# Patient Record
Sex: Female | Born: 1950 | Race: White | Hispanic: No | Marital: Married
Health system: Southern US, Community
[De-identification: ages and names within clinical notes are randomized; demographics above are authoritative.]

## PROBLEM LIST (undated history)

## (undated) ENCOUNTER — Emergency Department (HOSPITAL_COMMUNITY): Payer: Self-pay | Source: Home / Self Care

## (undated) DIAGNOSIS — I10 Essential (primary) hypertension: Secondary | ICD-10-CM

## (undated) DIAGNOSIS — R569 Unspecified convulsions: Secondary | ICD-10-CM

## (undated) DIAGNOSIS — Z9861 Coronary angioplasty status: Secondary | ICD-10-CM

## (undated) DIAGNOSIS — Z85118 Personal history of other malignant neoplasm of bronchus and lung: Secondary | ICD-10-CM

## (undated) DIAGNOSIS — E785 Hyperlipidemia, unspecified: Secondary | ICD-10-CM

## (undated) DIAGNOSIS — G4733 Obstructive sleep apnea (adult) (pediatric): Secondary | ICD-10-CM

## (undated) DIAGNOSIS — I2510 Atherosclerotic heart disease of native coronary artery without angina pectoris: Secondary | ICD-10-CM

## (undated) DIAGNOSIS — I480 Paroxysmal atrial fibrillation: Secondary | ICD-10-CM

## (undated) DIAGNOSIS — F329 Major depressive disorder, single episode, unspecified: Secondary | ICD-10-CM

## (undated) DIAGNOSIS — I5032 Chronic diastolic (congestive) heart failure: Secondary | ICD-10-CM

## (undated) DIAGNOSIS — J449 Chronic obstructive pulmonary disease, unspecified: Secondary | ICD-10-CM

## (undated) DIAGNOSIS — I739 Peripheral vascular disease, unspecified: Secondary | ICD-10-CM

## (undated) HISTORY — PX: FEMORAL-POPLITEAL BYPASS GRAFT: SHX937

---

## 2012-08-01 HISTORY — PX: CARDIAC CATHETERIZATION: SHX172

## 2014-08-01 HISTORY — PX: CARDIAC CATHETERIZATION: SHX172

## 2015-08-02 HISTORY — PX: CARDIAC CATHETERIZATION: SHX172

## 2017-05-31 ENCOUNTER — Inpatient Hospital Stay: Admission: RE | Admit: 2017-05-31 | Payer: Self-pay

## 2017-05-31 ENCOUNTER — Inpatient Hospital Stay
Admission: AD | Admit: 2017-05-31 | Discharge: 2017-07-03 | Disposition: A | Discharge status: Skilled Nursing Facility | Payer: Medicare Other | Source: Other Acute Inpatient Hospital | Attending: Internal Medicine | Admitting: Internal Medicine

## 2017-05-31 ENCOUNTER — Other Ambulatory Visit (HOSPITAL_COMMUNITY): Payer: Self-pay

## 2017-05-31 DIAGNOSIS — R0682 Tachypnea, not elsewhere classified: Secondary | ICD-10-CM

## 2017-05-31 DIAGNOSIS — R1110 Vomiting, unspecified: Secondary | ICD-10-CM

## 2017-05-31 DIAGNOSIS — J449 Chronic obstructive pulmonary disease, unspecified: Secondary | ICD-10-CM

## 2017-05-31 DIAGNOSIS — K5649 Other impaction of intestine: Secondary | ICD-10-CM

## 2017-05-31 DIAGNOSIS — K5900 Constipation, unspecified: Secondary | ICD-10-CM

## 2017-05-31 DIAGNOSIS — R0603 Acute respiratory distress: Secondary | ICD-10-CM

## 2017-05-31 DIAGNOSIS — N19 Unspecified kidney failure: Secondary | ICD-10-CM

## 2017-05-31 HISTORY — DX: Coronary angioplasty status: Z98.61

## 2017-05-31 HISTORY — DX: Chronic obstructive pulmonary disease, unspecified: J44.9

## 2017-05-31 HISTORY — DX: Obstructive sleep apnea (adult) (pediatric): G47.33

## 2017-05-31 HISTORY — DX: Unspecified convulsions: R56.9

## 2017-05-31 HISTORY — DX: Paroxysmal atrial fibrillation: I48.0

## 2017-05-31 HISTORY — DX: Major depressive disorder, single episode, unspecified: F32.9

## 2017-05-31 HISTORY — DX: Atherosclerotic heart disease of native coronary artery without angina pectoris: I25.10

## 2017-05-31 HISTORY — DX: Essential (primary) hypertension: I10

## 2017-05-31 HISTORY — DX: Personal history of other malignant neoplasm of bronchus and lung: Z85.118

## 2017-05-31 HISTORY — DX: Hyperlipidemia, unspecified: E78.5

## 2017-05-31 HISTORY — DX: Peripheral vascular disease, unspecified: I73.9

## 2017-05-31 HISTORY — DX: Chronic diastolic (congestive) heart failure: I50.32

## 2017-06-01 LAB — COMPREHENSIVE METABOLIC PANEL
ALBUMIN: 2.6 g/dL — AB (ref 3.5–5.0)
ALT: 12 U/L — ABNORMAL LOW (ref 14–54)
ANION GAP: 5 (ref 5–15)
AST: 13 U/L — ABNORMAL LOW (ref 15–41)
Alkaline Phosphatase: 46 U/L (ref 38–126)
BILIRUBIN TOTAL: 0.6 mg/dL (ref 0.3–1.2)
BUN: 32 mg/dL — ABNORMAL HIGH (ref 6–20)
CO2: 33 mmol/L — ABNORMAL HIGH (ref 22–32)
Calcium: 8.6 mg/dL — ABNORMAL LOW (ref 8.9–10.3)
Chloride: 104 mmol/L (ref 101–111)
Creatinine, Ser: 1.27 mg/dL — ABNORMAL HIGH (ref 0.44–1.00)
GFR calc Af Amer: 49 mL/min — ABNORMAL LOW (ref >60)
GFR, EST NON AFRICAN AMERICAN: 43 mL/min — AB (ref >60)
GLUCOSE: 95 mg/dL (ref 65–99)
POTASSIUM: 4.9 mmol/L (ref 3.5–5.1)
Sodium: 142 mmol/L (ref 135–145)
TOTAL PROTEIN: 5.4 g/dL — AB (ref 6.5–8.1)

## 2017-06-01 LAB — CBC WITH DIFFERENTIAL/PLATELET
Basophils Absolute: 0 10*3/uL (ref 0.0–0.1)
Basophils Relative: 0 %
EOS ABS: 0.1 10*3/uL (ref 0.0–0.7)
Eosinophils Relative: 2 %
HEMATOCRIT: 25.8 % — AB (ref 36.0–46.0)
HEMOGLOBIN: 7.9 g/dL — AB (ref 12.0–15.0)
LYMPHS ABS: 1 10*3/uL (ref 0.7–4.0)
Lymphocytes Relative: 14 %
MCH: 29.6 pg (ref 26.0–34.0)
MCHC: 30.6 g/dL (ref 30.0–36.0)
MCV: 96.6 fL (ref 78.0–100.0)
MONO ABS: 0.5 10*3/uL (ref 0.1–1.0)
MONOS PCT: 6 %
NEUTROS PCT: 78 %
Neutro Abs: 5.9 10*3/uL (ref 1.7–7.7)
Platelets: 150 10*3/uL (ref 150–400)
RBC: 2.67 MIL/uL — ABNORMAL LOW (ref 3.87–5.11)
RDW: 16.3 % — ABNORMAL HIGH (ref 11.5–15.5)
WBC: 7.5 10*3/uL (ref 4.0–10.5)

## 2017-06-01 LAB — PROTIME-INR
INR: 1.31
Prothrombin Time: 16.2 seconds — ABNORMAL HIGH (ref 11.4–15.2)

## 2017-06-01 LAB — TSH: TSH: 2.284 u[IU]/mL (ref 0.350–4.500)

## 2017-06-01 LAB — HEMOGLOBIN A1C
HEMOGLOBIN A1C: 5.3 % (ref 4.8–5.6)
MEAN PLASMA GLUCOSE: 105.41 mg/dL

## 2017-06-01 LAB — PHOSPHORUS: Phosphorus: 3 mg/dL (ref 2.5–4.6)

## 2017-06-01 LAB — MAGNESIUM: MAGNESIUM: 2.6 mg/dL — AB (ref 1.7–2.4)

## 2017-06-02 LAB — RENAL FUNCTION PANEL
ALBUMIN: 2.6 g/dL — AB (ref 3.5–5.0)
Anion gap: 5 (ref 5–15)
BUN: 32 mg/dL — AB (ref 6–20)
CO2: 34 mmol/L — ABNORMAL HIGH (ref 22–32)
CREATININE: 1.28 mg/dL — AB (ref 0.44–1.00)
Calcium: 8.9 mg/dL (ref 8.9–10.3)
Chloride: 102 mmol/L (ref 101–111)
GFR calc Af Amer: 49 mL/min — ABNORMAL LOW (ref >60)
GFR, EST NON AFRICAN AMERICAN: 42 mL/min — AB (ref >60)
Glucose, Bld: 135 mg/dL — ABNORMAL HIGH (ref 65–99)
PHOSPHORUS: 2.9 mg/dL (ref 2.5–4.6)
POTASSIUM: 4.9 mmol/L (ref 3.5–5.1)
Sodium: 141 mmol/L (ref 135–145)

## 2017-06-02 LAB — CBC
HCT: 24.4 % — ABNORMAL LOW (ref 36.0–46.0)
Hemoglobin: 7.5 g/dL — ABNORMAL LOW (ref 12.0–15.0)
MCH: 29.3 pg (ref 26.0–34.0)
MCHC: 30.7 g/dL (ref 30.0–36.0)
MCV: 95.3 fL (ref 78.0–100.0)
PLATELETS: 137 10*3/uL — AB (ref 150–400)
RBC: 2.56 MIL/uL — AB (ref 3.87–5.11)
RDW: 16.6 % — ABNORMAL HIGH (ref 11.5–15.5)
WBC: 7 10*3/uL (ref 4.0–10.5)

## 2017-06-02 LAB — MAGNESIUM: Magnesium: 2.4 mg/dL (ref 1.7–2.4)

## 2017-06-02 LAB — URINE CULTURE: Culture: NO GROWTH

## 2017-06-05 LAB — CBC
HCT: 27.9 % — ABNORMAL LOW (ref 36.0–46.0)
Hemoglobin: 8.6 g/dL — ABNORMAL LOW (ref 12.0–15.0)
MCH: 29.4 pg (ref 26.0–34.0)
MCHC: 30.8 g/dL (ref 30.0–36.0)
MCV: 95.2 fL (ref 78.0–100.0)
PLATELETS: 156 10*3/uL (ref 150–400)
RBC: 2.93 MIL/uL — AB (ref 3.87–5.11)
RDW: 16.9 % — AB (ref 11.5–15.5)
WBC: 7 10*3/uL (ref 4.0–10.5)

## 2017-06-05 LAB — RENAL FUNCTION PANEL
Albumin: 2.9 g/dL — ABNORMAL LOW (ref 3.5–5.0)
Anion gap: 8 (ref 5–15)
BUN: 46 mg/dL — AB (ref 6–20)
CALCIUM: 9.2 mg/dL (ref 8.9–10.3)
CHLORIDE: 98 mmol/L — AB (ref 101–111)
CO2: 35 mmol/L — AB (ref 22–32)
CREATININE: 1.37 mg/dL — AB (ref 0.44–1.00)
GFR, EST AFRICAN AMERICAN: 45 mL/min — AB (ref >60)
GFR, EST NON AFRICAN AMERICAN: 39 mL/min — AB (ref >60)
Glucose, Bld: 103 mg/dL — ABNORMAL HIGH (ref 65–99)
Phosphorus: 4.4 mg/dL (ref 2.5–4.6)
Potassium: 4.1 mmol/L (ref 3.5–5.1)
SODIUM: 141 mmol/L (ref 135–145)

## 2017-06-05 LAB — MAGNESIUM: Magnesium: 2.2 mg/dL (ref 1.7–2.4)

## 2017-06-07 ENCOUNTER — Other Ambulatory Visit (HOSPITAL_COMMUNITY): Payer: Self-pay

## 2017-06-07 LAB — BLOOD GAS, ARTERIAL
ACID-BASE EXCESS: 8.7 mmol/L — AB (ref 0.0–2.0)
BICARBONATE: 33.3 mmol/L — AB (ref 20.0–28.0)
Drawn by: 244851
O2 Content: 2 L/min
O2 Saturation: 98.6 %
PH ART: 7.425 (ref 7.350–7.450)
PO2 ART: 114 mmHg — AB (ref 83.0–108.0)
Patient temperature: 98.6
pCO2 arterial: 51.7 mmHg — ABNORMAL HIGH (ref 32.0–48.0)

## 2017-06-07 MED ORDER — POTASSIUM CHLORIDE 20 MEQ/15ML (10%) PO SOLN
ORAL | Status: DC
Start: ? — End: 2017-06-07

## 2017-06-07 MED ORDER — ATORVASTATIN CALCIUM 20 MG PO TABS
40.00 | ORAL_TABLET | ORAL | Status: DC
Start: 2017-06-01 — End: 2017-06-07

## 2017-06-07 MED ORDER — APIXABAN 5 MG PO TABS
5.00 | ORAL_TABLET | ORAL | Status: DC
Start: 2017-05-31 — End: 2017-06-07

## 2017-06-07 MED ORDER — AMIODARONE HCL 200 MG PO TABS
200.00 | ORAL_TABLET | ORAL | Status: DC
Start: 2017-06-01 — End: 2017-06-07

## 2017-06-07 MED ORDER — PREDNISONE 20 MG PO TABS
40.00 | ORAL_TABLET | ORAL | Status: DC
Start: 2017-06-01 — End: 2017-06-07

## 2017-06-07 MED ORDER — POTASSIUM CHLORIDE CRYS ER 20 MEQ PO TBCR
EXTENDED_RELEASE_TABLET | ORAL | Status: DC
Start: ? — End: 2017-06-07

## 2017-06-07 MED ORDER — FUROSEMIDE 20 MG PO TABS
20.00 | ORAL_TABLET | ORAL | Status: DC
Start: 2017-06-01 — End: 2017-06-07

## 2017-06-07 MED ORDER — UMECLIDINIUM BROMIDE 62.5 MCG/INH IN AEPB
INHALATION_SPRAY | RESPIRATORY_TRACT | Status: DC
Start: 2017-05-31 — End: 2017-06-07

## 2017-06-07 MED ORDER — GENERIC EXTERNAL MEDICATION
Status: DC
Start: ? — End: 2017-06-07

## 2017-06-07 MED ORDER — LIDOCAINE HCL (PF) 1 % IJ SOLN
.50 | INTRAMUSCULAR | Status: DC
Start: ? — End: 2017-06-07

## 2017-06-07 MED ORDER — FLUTICASONE FUROATE-VILANTEROL 100-25 MCG/INH IN AEPB
INHALATION_SPRAY | RESPIRATORY_TRACT | Status: DC
Start: 2017-06-01 — End: 2017-06-07

## 2017-06-07 MED ORDER — PANTOPRAZOLE SODIUM 40 MG PO TBEC
40.00 | DELAYED_RELEASE_TABLET | ORAL | Status: DC
Start: 2017-06-01 — End: 2017-06-07

## 2017-06-07 MED ORDER — DOCUSATE SODIUM 100 MG PO CAPS
100.00 | ORAL_CAPSULE | ORAL | Status: DC
Start: 2017-05-31 — End: 2017-06-07

## 2017-06-07 MED ORDER — ACETAMINOPHEN 325 MG PO TABS
650.00 | ORAL_TABLET | ORAL | Status: DC
Start: ? — End: 2017-06-07

## 2017-06-07 MED ORDER — NALOXONE HCL 0.4 MG/ML IJ SOLN
.40 | INTRAMUSCULAR | Status: DC
Start: ? — End: 2017-06-07

## 2017-06-07 MED ORDER — INSULIN LISPRO 100 UNIT/ML ~~LOC~~ SOLN
SUBCUTANEOUS | Status: DC
Start: 2017-05-31 — End: 2017-06-07

## 2017-06-07 MED ORDER — TRAZODONE HCL 50 MG PO TABS
50.00 | ORAL_TABLET | ORAL | Status: DC
Start: 2017-05-31 — End: 2017-06-07

## 2017-06-07 MED ORDER — INSULIN LISPRO 100 UNIT/ML ~~LOC~~ SOLN
SUBCUTANEOUS | Status: DC
Start: ? — End: 2017-06-07

## 2017-06-07 MED ORDER — INSULIN GLARGINE 100 UNIT/ML ~~LOC~~ SOLN
SUBCUTANEOUS | Status: DC
Start: 2017-05-31 — End: 2017-06-07

## 2017-06-07 MED ORDER — POTASSIUM CHLORIDE 20 MEQ/50ML IV SOLN
INTRAVENOUS | Status: DC
Start: ? — End: 2017-06-07

## 2017-06-07 MED ORDER — SERTRALINE HCL 50 MG PO TABS
50.00 | ORAL_TABLET | ORAL | Status: DC
Start: 2017-06-01 — End: 2017-06-07

## 2017-06-07 MED ORDER — FERROUS SULFATE 325 (65 FE) MG PO TABS
325.00 | ORAL_TABLET | ORAL | Status: DC
Start: 2017-06-01 — End: 2017-06-07

## 2017-06-07 MED ORDER — SODIUM CHLORIDE 0.9 % IV SOLN
INTRAVENOUS | Status: DC
Start: ? — End: 2017-06-07

## 2017-06-07 MED ORDER — ANTACID & ANTIGAS 200-200-20 MG/5ML PO SUSP
30.00 | ORAL | Status: DC
Start: ? — End: 2017-06-07

## 2017-06-07 MED ORDER — ALBUTEROL SULFATE (2.5 MG/3ML) 0.083% IN NEBU
2.50 | INHALATION_SOLUTION | RESPIRATORY_TRACT | Status: DC
Start: ? — End: 2017-06-07

## 2017-06-07 MED ORDER — ASPIRIN 81 MG PO CHEW
81.00 | CHEWABLE_TABLET | ORAL | Status: DC
Start: 2017-06-01 — End: 2017-06-07

## 2017-06-07 MED ORDER — LEVETIRACETAM 500 MG PO TABS
750.00 | ORAL_TABLET | ORAL | Status: DC
Start: 2017-05-31 — End: 2017-06-07

## 2017-06-07 MED ORDER — ONDANSETRON HCL 4 MG PO TABS
4.00 | ORAL_TABLET | ORAL | Status: DC
Start: ? — End: 2017-06-07

## 2017-06-07 MED ORDER — NITROGLYCERIN 0.4 MG SL SUBL
.40 | SUBLINGUAL_TABLET | SUBLINGUAL | Status: DC
Start: ? — End: 2017-06-07

## 2017-06-09 LAB — CBC
HCT: 30.3 % — ABNORMAL LOW (ref 36.0–46.0)
HEMOGLOBIN: 9.1 g/dL — AB (ref 12.0–15.0)
MCH: 28.5 pg (ref 26.0–34.0)
MCHC: 30 g/dL (ref 30.0–36.0)
MCV: 95 fL (ref 78.0–100.0)
Platelets: 167 10*3/uL (ref 150–400)
RBC: 3.19 MIL/uL — AB (ref 3.87–5.11)
RDW: 15.8 % — ABNORMAL HIGH (ref 11.5–15.5)
WBC: 7.5 10*3/uL (ref 4.0–10.5)

## 2017-06-09 LAB — BASIC METABOLIC PANEL
ANION GAP: 10 (ref 5–15)
BUN: 58 mg/dL — AB (ref 6–20)
CHLORIDE: 95 mmol/L — AB (ref 101–111)
CO2: 36 mmol/L — ABNORMAL HIGH (ref 22–32)
Calcium: 9.2 mg/dL (ref 8.9–10.3)
Creatinine, Ser: 1.87 mg/dL — ABNORMAL HIGH (ref 0.44–1.00)
GFR calc non Af Amer: 27 mL/min — ABNORMAL LOW (ref >60)
GFR, EST AFRICAN AMERICAN: 31 mL/min — AB (ref >60)
Glucose, Bld: 113 mg/dL — ABNORMAL HIGH (ref 65–99)
POTASSIUM: 5.6 mmol/L — AB (ref 3.5–5.1)
SODIUM: 141 mmol/L (ref 135–145)

## 2017-06-10 ENCOUNTER — Other Ambulatory Visit (HOSPITAL_COMMUNITY): Payer: Self-pay

## 2017-06-10 LAB — BASIC METABOLIC PANEL
ANION GAP: 9 (ref 5–15)
BUN: 52 mg/dL — AB (ref 6–20)
CHLORIDE: 98 mmol/L — AB (ref 101–111)
CO2: 32 mmol/L (ref 22–32)
Calcium: 8.5 mg/dL — ABNORMAL LOW (ref 8.9–10.3)
Creatinine, Ser: 1.35 mg/dL — ABNORMAL HIGH (ref 0.44–1.00)
GFR, EST AFRICAN AMERICAN: 46 mL/min — AB (ref >60)
GFR, EST NON AFRICAN AMERICAN: 40 mL/min — AB (ref >60)
Glucose, Bld: 130 mg/dL — ABNORMAL HIGH (ref 65–99)
POTASSIUM: 4.4 mmol/L (ref 3.5–5.1)
SODIUM: 139 mmol/L (ref 135–145)

## 2017-06-12 ENCOUNTER — Other Ambulatory Visit (HOSPITAL_COMMUNITY): Payer: Self-pay

## 2017-06-12 LAB — BASIC METABOLIC PANEL
Anion gap: 7 (ref 5–15)
BUN: 44 mg/dL — AB (ref 6–20)
CHLORIDE: 102 mmol/L (ref 101–111)
CO2: 32 mmol/L (ref 22–32)
CREATININE: 1.22 mg/dL — AB (ref 0.44–1.00)
Calcium: 8.5 mg/dL — ABNORMAL LOW (ref 8.9–10.3)
GFR calc Af Amer: 52 mL/min — ABNORMAL LOW (ref >60)
GFR calc non Af Amer: 45 mL/min — ABNORMAL LOW (ref >60)
Glucose, Bld: 97 mg/dL (ref 65–99)
Potassium: 4.3 mmol/L (ref 3.5–5.1)
Sodium: 141 mmol/L (ref 135–145)

## 2017-06-16 LAB — CBC
HCT: 29.9 % — ABNORMAL LOW (ref 36.0–46.0)
Hemoglobin: 9.2 g/dL — ABNORMAL LOW (ref 12.0–15.0)
MCH: 29.8 pg (ref 26.0–34.0)
MCHC: 30.8 g/dL (ref 30.0–36.0)
MCV: 96.8 fL (ref 78.0–100.0)
PLATELETS: 142 10*3/uL — AB (ref 150–400)
RBC: 3.09 MIL/uL — ABNORMAL LOW (ref 3.87–5.11)
RDW: 15.8 % — AB (ref 11.5–15.5)
WBC: 7.2 10*3/uL (ref 4.0–10.5)

## 2017-06-16 LAB — RENAL FUNCTION PANEL
Albumin: 2.7 g/dL — ABNORMAL LOW (ref 3.5–5.0)
Anion gap: 6 (ref 5–15)
BUN: 44 mg/dL — AB (ref 6–20)
CHLORIDE: 101 mmol/L (ref 101–111)
CO2: 33 mmol/L — ABNORMAL HIGH (ref 22–32)
CREATININE: 1.47 mg/dL — AB (ref 0.44–1.00)
Calcium: 8.9 mg/dL (ref 8.9–10.3)
GFR calc Af Amer: 41 mL/min — ABNORMAL LOW (ref >60)
GFR, EST NON AFRICAN AMERICAN: 36 mL/min — AB (ref >60)
GLUCOSE: 110 mg/dL — AB (ref 65–99)
POTASSIUM: 4.8 mmol/L (ref 3.5–5.1)
Phosphorus: 4 mg/dL (ref 2.5–4.6)
Sodium: 140 mmol/L (ref 135–145)

## 2017-06-16 LAB — MAGNESIUM: MAGNESIUM: 2.5 mg/dL — AB (ref 1.7–2.4)

## 2017-06-17 LAB — URINALYSIS, ROUTINE W REFLEX MICROSCOPIC
BILIRUBIN URINE: NEGATIVE
Glucose, UA: NEGATIVE mg/dL
HGB URINE DIPSTICK: NEGATIVE
KETONES UR: NEGATIVE mg/dL
NITRITE: NEGATIVE
PH: 5 (ref 5.0–8.0)
Protein, ur: NEGATIVE mg/dL
Specific Gravity, Urine: 1.016 (ref 1.005–1.030)

## 2017-06-19 LAB — PHOSPHORUS: Phosphorus: 3.3 mg/dL (ref 2.5–4.6)

## 2017-06-19 LAB — URINE CULTURE

## 2017-06-19 LAB — BASIC METABOLIC PANEL
Anion gap: 6 (ref 5–15)
BUN: 43 mg/dL — ABNORMAL HIGH (ref 6–20)
CALCIUM: 8.2 mg/dL — AB (ref 8.9–10.3)
CO2: 29 mmol/L (ref 22–32)
CREATININE: 1.43 mg/dL — AB (ref 0.44–1.00)
Chloride: 104 mmol/L (ref 101–111)
GFR, EST AFRICAN AMERICAN: 43 mL/min — AB (ref >60)
GFR, EST NON AFRICAN AMERICAN: 37 mL/min — AB (ref >60)
GLUCOSE: 105 mg/dL — AB (ref 65–99)
Potassium: 4.5 mmol/L (ref 3.5–5.1)
Sodium: 139 mmol/L (ref 135–145)

## 2017-06-19 LAB — CBC
HEMATOCRIT: 28.5 % — AB (ref 36.0–46.0)
Hemoglobin: 8.5 g/dL — ABNORMAL LOW (ref 12.0–15.0)
MCH: 28.4 pg (ref 26.0–34.0)
MCHC: 29.8 g/dL — ABNORMAL LOW (ref 30.0–36.0)
MCV: 95.3 fL (ref 78.0–100.0)
PLATELETS: 126 10*3/uL — AB (ref 150–400)
RBC: 2.99 MIL/uL — ABNORMAL LOW (ref 3.87–5.11)
RDW: 15.5 % (ref 11.5–15.5)
WBC: 8.1 10*3/uL (ref 4.0–10.5)

## 2017-06-19 LAB — MAGNESIUM: Magnesium: 2.4 mg/dL (ref 1.7–2.4)

## 2017-06-20 ENCOUNTER — Other Ambulatory Visit (HOSPITAL_COMMUNITY): Payer: Self-pay

## 2017-06-20 LAB — BASIC METABOLIC PANEL
Anion gap: 10 (ref 5–15)
BUN: 44 mg/dL — AB (ref 6–20)
CALCIUM: 8.6 mg/dL — AB (ref 8.9–10.3)
CHLORIDE: 107 mmol/L (ref 101–111)
CO2: 23 mmol/L (ref 22–32)
CREATININE: 1.37 mg/dL — AB (ref 0.44–1.00)
GFR calc Af Amer: 45 mL/min — ABNORMAL LOW (ref >60)
GFR calc non Af Amer: 39 mL/min — ABNORMAL LOW (ref >60)
Glucose, Bld: 125 mg/dL — ABNORMAL HIGH (ref 65–99)
Potassium: 4.8 mmol/L (ref 3.5–5.1)
SODIUM: 140 mmol/L (ref 135–145)

## 2017-06-20 MED ORDER — GENERIC EXTERNAL MEDICATION
Status: DC
Start: ? — End: 2017-06-20

## 2017-06-21 LAB — BASIC METABOLIC PANEL
ANION GAP: 7 (ref 5–15)
BUN: 54 mg/dL — ABNORMAL HIGH (ref 6–20)
CHLORIDE: 106 mmol/L (ref 101–111)
CO2: 26 mmol/L (ref 22–32)
CREATININE: 2.2 mg/dL — AB (ref 0.44–1.00)
Calcium: 8.4 mg/dL — ABNORMAL LOW (ref 8.9–10.3)
GFR calc non Af Amer: 22 mL/min — ABNORMAL LOW (ref >60)
GFR, EST AFRICAN AMERICAN: 25 mL/min — AB (ref >60)
Glucose, Bld: 164 mg/dL — ABNORMAL HIGH (ref 65–99)
POTASSIUM: 5.4 mmol/L — AB (ref 3.5–5.1)
SODIUM: 139 mmol/L (ref 135–145)

## 2017-06-21 LAB — POTASSIUM: POTASSIUM: 5 mmol/L (ref 3.5–5.1)

## 2017-06-21 LAB — MAGNESIUM: MAGNESIUM: 2.6 mg/dL — AB (ref 1.7–2.4)

## 2017-06-22 ENCOUNTER — Other Ambulatory Visit (HOSPITAL_COMMUNITY): Payer: Self-pay

## 2017-06-22 LAB — BASIC METABOLIC PANEL
ANION GAP: 10 (ref 5–15)
BUN: 65 mg/dL — ABNORMAL HIGH (ref 6–20)
CALCIUM: 7.5 mg/dL — AB (ref 8.9–10.3)
CHLORIDE: 107 mmol/L (ref 101–111)
CO2: 23 mmol/L (ref 22–32)
CREATININE: 2.58 mg/dL — AB (ref 0.44–1.00)
GFR calc non Af Amer: 18 mL/min — ABNORMAL LOW (ref >60)
GFR, EST AFRICAN AMERICAN: 21 mL/min — AB (ref >60)
GLUCOSE: 126 mg/dL — AB (ref 65–99)
POTASSIUM: 4.2 mmol/L (ref 3.5–5.1)
SODIUM: 140 mmol/L (ref 135–145)

## 2017-06-23 LAB — BASIC METABOLIC PANEL
Anion gap: 9 (ref 5–15)
BUN: 54 mg/dL — AB (ref 6–20)
CHLORIDE: 109 mmol/L (ref 101–111)
CO2: 24 mmol/L (ref 22–32)
Calcium: 7.8 mg/dL — ABNORMAL LOW (ref 8.9–10.3)
Creatinine, Ser: 2.1 mg/dL — ABNORMAL HIGH (ref 0.44–1.00)
GFR calc Af Amer: 27 mL/min — ABNORMAL LOW (ref >60)
GFR calc non Af Amer: 23 mL/min — ABNORMAL LOW (ref >60)
GLUCOSE: 178 mg/dL — AB (ref 65–99)
Potassium: 2.9 mmol/L — ABNORMAL LOW (ref 3.5–5.1)
Sodium: 142 mmol/L (ref 135–145)

## 2017-06-23 LAB — CBC
HEMATOCRIT: 25.7 % — AB (ref 36.0–46.0)
HEMOGLOBIN: 8 g/dL — AB (ref 12.0–15.0)
MCH: 29.9 pg (ref 26.0–34.0)
MCHC: 31.1 g/dL (ref 30.0–36.0)
MCV: 95.9 fL (ref 78.0–100.0)
Platelets: 137 10*3/uL — ABNORMAL LOW (ref 150–400)
RBC: 2.68 MIL/uL — ABNORMAL LOW (ref 3.87–5.11)
RDW: 15.7 % — ABNORMAL HIGH (ref 11.5–15.5)
WBC: 8 10*3/uL (ref 4.0–10.5)

## 2017-06-23 LAB — POTASSIUM: Potassium: 3.7 mmol/L (ref 3.5–5.1)

## 2017-06-24 LAB — BASIC METABOLIC PANEL
ANION GAP: 6 (ref 5–15)
BUN: 43 mg/dL — ABNORMAL HIGH (ref 6–20)
CO2: 24 mmol/L (ref 22–32)
Calcium: 7.8 mg/dL — ABNORMAL LOW (ref 8.9–10.3)
Chloride: 112 mmol/L — ABNORMAL HIGH (ref 101–111)
Creatinine, Ser: 1.71 mg/dL — ABNORMAL HIGH (ref 0.44–1.00)
GFR calc Af Amer: 35 mL/min — ABNORMAL LOW (ref >60)
GFR calc non Af Amer: 30 mL/min — ABNORMAL LOW (ref >60)
GLUCOSE: 95 mg/dL (ref 65–99)
POTASSIUM: 3.6 mmol/L (ref 3.5–5.1)
Sodium: 142 mmol/L (ref 135–145)

## 2017-06-24 LAB — MAGNESIUM: Magnesium: 2.4 mg/dL (ref 1.7–2.4)

## 2017-06-25 ENCOUNTER — Other Ambulatory Visit (HOSPITAL_COMMUNITY): Payer: Self-pay

## 2017-06-25 LAB — CK TOTAL AND CKMB (NOT AT ARMC)
CK TOTAL: 18 U/L — AB (ref 38–234)
CK, MB: 2.1 ng/mL (ref 0.5–5.0)
CK, MB: 2.9 ng/mL (ref 0.5–5.0)
RELATIVE INDEX: INVALID (ref 0.0–2.5)
Relative Index: INVALID (ref 0.0–2.5)
Total CK: 15 U/L — ABNORMAL LOW (ref 38–234)

## 2017-06-25 LAB — BASIC METABOLIC PANEL
ANION GAP: 6 (ref 5–15)
BUN: 33 mg/dL — ABNORMAL HIGH (ref 6–20)
CALCIUM: 7.6 mg/dL — AB (ref 8.9–10.3)
CO2: 22 mmol/L (ref 22–32)
CREATININE: 1.29 mg/dL — AB (ref 0.44–1.00)
Chloride: 114 mmol/L — ABNORMAL HIGH (ref 101–111)
GFR calc non Af Amer: 42 mL/min — ABNORMAL LOW (ref >60)
GFR, EST AFRICAN AMERICAN: 49 mL/min — AB (ref >60)
Glucose, Bld: 96 mg/dL (ref 65–99)
Potassium: 3.7 mmol/L (ref 3.5–5.1)
SODIUM: 142 mmol/L (ref 135–145)

## 2017-06-25 LAB — TROPONIN I
TROPONIN I: 0.05 ng/mL — AB (ref <0.03)
TROPONIN I: 0.06 ng/mL — AB (ref <0.03)
Troponin I: 0.04 ng/mL (ref <0.03)
Troponin I: 0.05 ng/mL (ref <0.03)

## 2017-06-26 LAB — CULTURE, BLOOD (ROUTINE X 2)
Culture: NO GROWTH
Culture: NO GROWTH
SPECIAL REQUESTS: ADEQUATE
Special Requests: ADEQUATE

## 2017-06-26 LAB — TROPONIN I: Troponin I: 0.06 ng/mL (ref <0.03)

## 2017-06-27 ENCOUNTER — Encounter: Payer: Self-pay | Admitting: Internal Medicine

## 2017-06-27 DIAGNOSIS — D649 Anemia, unspecified: Secondary | ICD-10-CM | POA: Diagnosis not present

## 2017-06-27 DIAGNOSIS — I480 Paroxysmal atrial fibrillation: Secondary | ICD-10-CM | POA: Diagnosis not present

## 2017-06-27 DIAGNOSIS — I214 Non-ST elevation (NSTEMI) myocardial infarction: Secondary | ICD-10-CM | POA: Diagnosis not present

## 2017-06-27 DIAGNOSIS — G4733 Obstructive sleep apnea (adult) (pediatric): Secondary | ICD-10-CM

## 2017-06-27 DIAGNOSIS — E782 Mixed hyperlipidemia: Secondary | ICD-10-CM | POA: Diagnosis not present

## 2017-06-27 DIAGNOSIS — I25110 Atherosclerotic heart disease of native coronary artery with unstable angina pectoris: Secondary | ICD-10-CM | POA: Diagnosis not present

## 2017-06-27 DIAGNOSIS — J449 Chronic obstructive pulmonary disease, unspecified: Secondary | ICD-10-CM | POA: Diagnosis not present

## 2017-06-27 LAB — CK TOTAL AND CKMB (NOT AT ARMC)
CK TOTAL: 85 U/L (ref 38–234)
CK, MB: 20.3 ng/mL — AB (ref 0.5–5.0)
CK, MB: 17.5 ng/mL — ABNORMAL HIGH (ref 0.5–5.0)
CK, MB: 14.7 ng/mL — ABNORMAL HIGH (ref 0.5–5.0)
RELATIVE INDEX: INVALID (ref 0.0–2.5)
Relative Index: INVALID (ref 0.0–2.5)
Relative Index: INVALID (ref 0.0–2.5)
Total CK: 69 U/L (ref 38–234)
Total CK: 55 U/L (ref 38–234)

## 2017-06-27 LAB — BASIC METABOLIC PANEL
ANION GAP: 7 (ref 5–15)
BUN: 25 mg/dL — ABNORMAL HIGH (ref 6–20)
CALCIUM: 8.7 mg/dL — AB (ref 8.9–10.3)
CHLORIDE: 110 mmol/L (ref 101–111)
CO2: 26 mmol/L (ref 22–32)
CREATININE: 1.26 mg/dL — AB (ref 0.44–1.00)
GFR calc non Af Amer: 43 mL/min — ABNORMAL LOW (ref >60)
GFR, EST AFRICAN AMERICAN: 50 mL/min — AB (ref >60)
GLUCOSE: 107 mg/dL — AB (ref 65–99)
POTASSIUM: 4.7 mmol/L (ref 3.5–5.1)
SODIUM: 143 mmol/L (ref 135–145)

## 2017-06-27 LAB — TROPONIN I
TROPONIN I: 2.75 ng/mL — AB (ref <0.03)
Troponin I: 3.23 ng/mL (ref <0.03)
Troponin I: 2.36 ng/mL (ref <0.03)

## 2017-06-27 LAB — CBC
HCT: 26.2 % — ABNORMAL LOW (ref 36.0–46.0)
HEMOGLOBIN: 7.8 g/dL — AB (ref 12.0–15.0)
MCH: 28.7 pg (ref 26.0–34.0)
MCHC: 29.8 g/dL — ABNORMAL LOW (ref 30.0–36.0)
MCV: 96.3 fL (ref 78.0–100.0)
PLATELETS: 150 10*3/uL (ref 150–400)
RBC: 2.72 MIL/uL — AB (ref 3.87–5.11)
RDW: 15.6 % — ABNORMAL HIGH (ref 11.5–15.5)
WBC: 6.3 10*3/uL (ref 4.0–10.5)

## 2017-06-27 NOTE — Consult Note (Signed)
Cardiology Consult    Patient ID: Heather Esparza MRN: [id number], DOB/AGE: 02-Dec-1950   Admit date: 05/31/2017 Date of Consult: 06/27/2017  Primary Physician: Jamesetta Geralds, MD Primary Cardiologist: Dr. Violeta Gelinas - [facility] Requesting Provider: Dr. Manson Passey - [facility]  Reason for Consult: Elevated troponin, chest pain  Patient Profile   Heather Esparza is a 66 y.o. female who is being seen today for the evaluation of elevated troponin at the request of Dr. Manson Passey. Patient with significant CAD history s/p 3 DES stents in RCA and NSTEMIs in the past along with chronic respiratory failure from COPD on chronic 2L oxygen, OSA, HTN, dCHF, HLD, morbid obesity, paroxysmal atrial fibrillation, and PAD s/p fem-pop bypass; she is currently in [facility] as bridge after recent hospitalization back to her SNF where she resides chronically. She started developing chest pain and was found to have elevated troponins with no acute EKG changes.  Past Medical History   Past Medical History:  Diagnosis Date  . CAD S/P percutaneous coronary angioplasty    RCA stents in 2014  . Chronic diastolic heart failure (HCC)   . COPD (chronic obstructive pulmonary disease) (HCC)    On chronic 2L [place], and Trelegy at night  . History of lung cancer   . HLD (hyperlipidemia)   . HTN (hypertension)   . Major depression   . Obstructive sleep apnea   . Paroxysmal atrial fibrillation (HCC)   . Peripheral arterial disease (HCC)    s/p fem-pop bypass  . Seizure (HCC)    on keppra    Past Surgical History:  Procedure Laterality Date  . CARDIAC CATHETERIZATION  2014   3 RCA stents  . CARDIAC CATHETERIZATION  2016   medical management  . CARDIAC CATHETERIZATION  2017   medical management  . FEMORAL-POPLITEAL BYPASS GRAFT      Allergies Allergies  Allergen Reactions  . Iodinated Diagnostic Agents Hives, Itching and Shortness Of Breath  . Meropenem     Other reaction(s): Seizures  . Digoxin And  Related Other (See Comments)    Other reaction(s): Other HOCM HOCM   . Latex Dermatitis, Hives and Rash  . Metformin Hcl Other (See Comments)    1[employer] Description: Metformin HCl TABS                                                    . Nitrates, Organic Other (See Comments)    Other reaction(s): Other (see comments) HOCM HOCM     History of Present Illness    Heather Esparza is a 66yo female seen in [facility] for evaluation of chest pain and elevated troponin. Patient states that about 4 days ago she started having some right sided chest pain that was similar to how she presented in the past with A fib with RVR; the pain radiated to her right neck and resolved on its own eventually. She had recurrence of pain more substernally a few times since then with occasional radiation to her bilateral jaw. The pain would subside on its own; she had not taken any nitro. She denies associated shortness of breath but does have occasional associated belching and dyspepsia which has been present in the past with her unstable angina and NSTEMIs. Pain always presents at rest; patient is nonambulatory due to femoral fracture 1.5 years ago.  During evaluation, patient had acute onset of similar chest pain and was gripping her left chest; she described it as similar to her previous NSTEMIs. Pain began to subside within 1-2 minutes without intervention. Telemetry during this episode revealed SR with HR in 70-80s. EKG was obtained and did not show ischemic changes compared to priors; SR.   Inpatient Medications    Ciprofloxacin 500mg  BID - end date 11/26 Amiodarone 200mg  MWF Anucort 25mg  BID suppository ASA 81mg  daily Atorvastatin 40mg  daily Eliquis 5mg  BID Ferrous sulfate 325mg  daily Fluticasone-salmetarol daily Lasix 20mg  PO daily Lantus 7 units qhs Keppra 750mg  BID Protonix 40mg  daily Prednisone 5mg  daily Zoloft 50mg  daily Trazodone 50mg  daily  Outpatient Medications    Prior  to Admission medications   Not on File   Eliquis 5mg  BID Tylenol prn Asa 81mg  dialy Atorvastatin 40mg  daily Amiodarone 200mg  daily Esomeprazole 40mg  daily Breo Ellipta daily Lasix 20mg  daily Gabapentin 300mg  BID INcuse Ellipta daily Duoneb PRN Levetiracetam 750mg  BID Metoprolol tartrate 25mg  BID Ondansetron 4mg  q8hr PRN Oxygen 2-3L PRN Miralax 17mg  daily Sertraline 50mg  dialy Trazodone 50mg  qhs  Family History    N/a No family history on file.  Social History    Social History   Socioeconomic History  . Marital status: Married    Spouse name: Not on file  . Number of children: Not on file  . Years of education: Not on file  . Highest education level: Not on file  Social Needs  . Financial resource strain: Not on file  . Food insecurity - worry: Not on file  . Food insecurity - inability: Not on file  . Transportation needs - medical: Not on file  . Transportation needs - non-medical: Not on file  Occupational History  . Not on file  Tobacco Use  . Smoking status: Not on file  Substance and Sexual Activity  . Alcohol use: Not on file  . Drug use: Not on file  . Sexual activity: Not on file  Other Topics Concern  . Not on file  Social History Narrative  . Not on file    Review of Systems   All other systems reviewed and are otherwise negative except as noted above.  Physical Exam    BP 132/78, HR 75, afebrile General: Pleasant, NAD Psych: Normal affect. Neuro: Alert and oriented X 3. Moves all extremities spontaneously. HEENT: Normal  Neck: Supple without bruits or JVD. Lungs:  Resp regular and unlabored, CTAB, decreased breath sounds throughout, no wheezing. Heart: RRR no s3, s4, or murmurs. Abdomen: Soft, non-tender, non-distended, +BS.  Extremities: No LE edema; Radial pulses intact L>R; DP pulses 1+ bilaterally; hammer toes, cold extremities  Labs    Troponin (Point of Care Test) No results for input(s): TROPIPOC in the last 72  hours. Recent Labs    06/25/17 1106  06/26/17 0203 06/27/17 0625 06/27/17 0931 06/27/17 1250  CKTOTAL 18*  --   --  85 69 55  CKMB 2.9  --   --  20.3* 17.5* 14.7*  TROPONINI 0.05*   < > 0.06* 3.23* 2.75* 2.36*   < > = values in this interval not displayed.   Lab Results  Component Value Date   WBC 6.3 06/27/2017   HGB 7.8 (L) 06/27/2017   HCT 26.2 (L) 06/27/2017   MCV 96.3 06/27/2017   PLT 150 06/27/2017    Recent Labs  Lab 06/27/17 0625  NA 143  K 4.7  CL 110  CO2 26  BUN 25*  CREATININE  1.26*  CALCIUM 8.7*  GLUCOSE 107*   No results found for: CHOL, HDL, LDLCALC, TRIG No results found for: [facility]   Radiology Studies    Ct Head Wo Contrast  Result Date: 06/07/2017 CLINICAL DATA:  Headache following recent trauma. Nausea and vomiting EXAM: CT HEAD WITHOUT CONTRAST TECHNIQUE: Contiguous axial images were obtained from the base of the skull through the vertex without intravenous contrast. COMPARISON:  None. FINDINGS: Brain: The ventricles and sulci are felt to be within normal limits for age. There is no intracranial mass, hemorrhage, extra-axial fluid collection, or midline shift. There is slight small vessel disease in the centra semiovale bilaterally. Elsewhere, gray-white compartments appear normal. No acute infarct evident. Vascular: No hyperdense vessel. There is calcification in each carotid siphon region as well as in each distal vertebral artery. Skull: Bony calvarium appears intact. Sinuses/Orbits: There is mucosal thickening in several ethmoid air cells bilaterally. There is patchy opacity in the inferior right frontal sinus. Other paranasal sinuses are clear. There is rightward deviation of the nasal septum. Orbits appear symmetric bilaterally. Other: Mastoid air cells are clear on the left. There is opacification of several inferior mastoid air cells on the right. IMPRESSION: Slight periventricular small vessel disease. No intracranial mass, hemorrhage, or  extra-axial fluid collection. There are multiple foci of arterial vascular calcification. There is mild paranasal sinus disease. There is opacification in several inferior mastoid air cells on the right. Electronically Signed   By: Bretta Bang III M.D.   On: 06/07/2017 16:09   Dg Chest Port 1 View  Result Date: 06/25/2017 CLINICAL DATA:  Tachypnea.  Respiratory distress. EXAM: PORTABLE CHEST 1 VIEW COMPARISON:  Radiographs of May 31, 2017. FINDINGS: Stable cardiomegaly and central pulmonary vascular congestion is noted. Stable bilateral diffuse lung densities are noted concerning for edema or atypical inflammation. Stable moderate left pleural effusion is noted with associated atelectasis or infiltrate. Aortic atherosclerosis. Bony thorax is unremarkable. IMPRESSION: Stable cardiomegaly and central pulmonary vascular congestion. Stable bilateral lung opacities are noted concerning for edema or atypical inflammation. Stable moderate left pleural effusion is noted with associated atelectasis or infiltrate. Electronically Signed   By: Lupita Raider, M.D.   On: 06/25/2017 08:41   Dg Chest Port 1 View  Result Date: 05/31/2017 CLINICAL DATA:  Renal failure.  Sepsis.  Cough. EXAM: PORTABLE CHEST 1 VIEW COMPARISON:  None. FINDINGS: Artifact overlies the chest. Cardiac silhouette is markedly enlarged. There is aortic atherosclerosis. There is abnormal density throughout the lungs consistent with fluid overload/ mild edema. There is more pronounced density in the lower chest bilaterally that could be due to a combination of pleural fluid, atelectasis and pneumonia. This appears worse on the left than the right. IMPRESSION: Probable congestive heart failure/ fluid overload. Bilateral effusions and abnormal lower lobe density left worse than right. Pneumonia could well be present in the lower lobes. Electronically Signed   By: Paulina Fusi M.D.   On: 05/31/2017 21:46   Dg Abd Portable 1v  Result Date:  06/22/2017 CLINICAL DATA:  Constipation EXAM: PORTABLE ABDOMEN - 1 VIEW COMPARISON:  Two days ago FINDINGS: Interval clearing of previously seen stool. Now gas is seen along distended but nondilated colon. Evidence of bilateral nephrolithiasis measuring up to 16 mm the left. Nonobstructive bowel gas pattern. IMPRESSION: 1. Interval clearing of previously seen large volume stool. 2. Moderate gas seen throughout the colon. 3. Probable bilateral renal calculi. Electronically Signed   By: Marnee Spring M.D.   On: 06/22/2017 17:24  Dg Abd Portable 1v  Result Date: 06/20/2017 CLINICAL DATA:  Initial evaluation for constipation. EXAM: PORTABLE ABDOMEN - 1 VIEW COMPARISON:  Prior radiograph from 06/12/2017. FINDINGS: Examination somewhat limited by body habitus and patient positioning. Bowel gas pattern within normal limits without obstruction. No appreciable free air. No abnormal bowel wall thickening. Moderate to large stool burden within the distal colon, which may be slightly improved from previous. Scoliosis with multilevel degenerative spondylolysis. IMPRESSION: Moderate to large stool burden within the distal colon, perhaps slightly improved relative to most recent radiograph from 06/12/2017, but still fairly significant. Electronically Signed   By: Rise Mu M.D.   On: 06/20/2017 04:10   Dg Abd Portable 1v  Result Date: 06/12/2017 CLINICAL DATA:  No bowel movement for the past 8 days. The patient ports abdominal pain and distension. EXAM: PORTABLE ABDOMEN - 1 VIEW COMPARISON:  Abdominal radiographs of May 10, 2017 FINDINGS: The colonic and rectal stool burden is increased. There is no small bowel obstructive pattern. No free extraluminal gas is observed. There is stable gentle levocurvature centered in the mid lumbar spine. IMPRESSION: Increase colonic and rectal stool burden. I cannot exclude a fecal impaction. Electronically Signed   By: David  [place] M.D.   On: 06/12/2017 11:46   Dg  Abd Portable 1v  Result Date: 06/10/2017 CLINICAL DATA:  Initial evaluation for 1 week history of vomiting. EXAM: PORTABLE ABDOMEN - 1 VIEW COMPARISON:  None. FINDINGS: Examination somewhat limited by body habitus and patient positioning. Visualized bowel gas pattern within normal limits without obstruction or ileus. No appreciable abnormal bowel wall thickening. Large volume stool throughout the colon and rectal vault, suggesting constipation. 2.4 cm calcification overlying the left abdomen suggestive of nephrolithiasis. Cholecystectomy clips noted. Extensive aorto bi-iliac atherosclerotic disease. I am female noted within the partially visualized left femur. Scoliosis with multilevel degenerative spondylolysis. IMPRESSION: 1. Nonobstructive bowel gas pattern. Large volume stool throughout the colon suggestive of constipation. 2. 2.3 cm calcification overlying the left abdomen, likely a renal calculus. 3. Prominent aorto bi-iliac atherosclerotic disease. Electronically Signed   By: Rise Mu M.D.   On: 06/10/2017 13:18    ECG & Cardiac Imaging    EKG 06/27/2017, personally reviewed - SR, poor R wave progression, 1mm ST elevation in lead V4 only; TW flattening in V4-6 compared to prior.  EKG 06/27/2017, personally reviewed - SR, no ST segment elevation or TWI  Echo 04/10/2017: The left ventricle is normal in size. Ejection Fraction = >55%. There is severe concentric left ventricular hypertrophy. The right ventricle is normal size. The right ventricle is normal in structure and function. Systolic anterior motion of mitral valve leaflet noted with chordal, septal contact. Associated turbulent flow noted. There is mild mitral regurgitation (1+). There is a trivial pericardial effusion of no hemodynamic significance.  LHC: May 2017 at [facility] for NSTEMI:  1. Diffuse, calcified, moderate severity narrowings, with angiography being essentially stable from previous from May 2016 -May 2016:  Chest pain admission --> false positive stress test --> LHC shows stable, moderate severity LAD disease; widely patient RCA stents; will treat medically. -June 2014: s/p rotablation guided PCI with DES x 2 (distal RCA, mid RCA)  Assessment & Plan    NSTEMI: Troponin peaked at 3.23 this AM. Patient with continued episodes of intermittent chest pain at rest.  --continue cycling troponins --f/u echo --add imdur 30mg  daily --please hold PM dose of Eliquis in case cath is needed in the morning  CAD: S/p 4 stents (most recent to  RCA in 2014).  --atorvastatin 40mg  daily --start coreg 3.125mg  BID - had itching and throat swelling sensation with metoprolol, but tolerated coreg in the past though per chart review from current [facility] stay, metoprolol was d/c'd due to bradycardia.  Anemia: Hgb down to 7.8 this morning. F/u CBC, obtain type&screen.   Paroxysmal A fib: On amiodarone and Eliquis. EKG and tele with SR throughout stay. Please hold evening eliquis dose (11/27) in anticipation of possible cardiac cath tomorrow.  CKD stage 3: Stable; Cr 1.3 - b/l today.  Signed, Nyra MarketGorica Svalina, MD 06/27/2017, 4:25 PM   I have examined the patient and reviewed assessment and plan and discussed with patient.  Agree with above as stated.  Patient with NSTEMI.  She has several comorbidities that make cath more risky, including anemia and recent renal dysfunction.    Would not start IV heparin due to Hgb < 8 at this time.  That could contribute to ischemia.  Currently in NSR despite h/o AFib.    Will check echo and start additional medical therapy including Imdur and Coreg.  Will check on her again tomorrow.  Check Type and screen in the event that cath is needed.  L>R radial pulse.  ECG during her chest pain unchanged from baseline ECG.  Lance MussJayadeep Garvin Ellena

## 2017-06-28 ENCOUNTER — Other Ambulatory Visit (HOSPITAL_COMMUNITY): Payer: BLUE CROSS/BLUE SHIELD

## 2017-06-28 DIAGNOSIS — I480 Paroxysmal atrial fibrillation: Secondary | ICD-10-CM

## 2017-06-28 DIAGNOSIS — I214 Non-ST elevation (NSTEMI) myocardial infarction: Secondary | ICD-10-CM

## 2017-06-28 DIAGNOSIS — I25110 Atherosclerotic heart disease of native coronary artery with unstable angina pectoris: Secondary | ICD-10-CM

## 2017-06-28 DIAGNOSIS — J449 Chronic obstructive pulmonary disease, unspecified: Secondary | ICD-10-CM

## 2017-06-28 LAB — BASIC METABOLIC PANEL
Anion gap: 5 (ref 5–15)
BUN: 25 mg/dL — AB (ref 6–20)
CHLORIDE: 110 mmol/L (ref 101–111)
CO2: 28 mmol/L (ref 22–32)
CREATININE: 1.08 mg/dL — AB (ref 0.44–1.00)
Calcium: 8.7 mg/dL — ABNORMAL LOW (ref 8.9–10.3)
GFR, EST NON AFRICAN AMERICAN: 52 mL/min — AB (ref >60)
Glucose, Bld: 103 mg/dL — ABNORMAL HIGH (ref 65–99)
POTASSIUM: 4.6 mmol/L (ref 3.5–5.1)
SODIUM: 143 mmol/L (ref 135–145)

## 2017-06-28 LAB — CBC
HCT: 25.4 % — ABNORMAL LOW (ref 36.0–46.0)
HEMOGLOBIN: 7.5 g/dL — AB (ref 12.0–15.0)
MCH: 28.6 pg (ref 26.0–34.0)
MCHC: 29.5 g/dL — AB (ref 30.0–36.0)
MCV: 96.9 fL (ref 78.0–100.0)
PLATELETS: 169 10*3/uL (ref 150–400)
RBC: 2.62 MIL/uL — AB (ref 3.87–5.11)
RDW: 15.6 % — ABNORMAL HIGH (ref 11.5–15.5)
WBC: 5.6 10*3/uL (ref 4.0–10.5)

## 2017-06-28 LAB — ABO/RH: ABO/RH(D): A POS

## 2017-06-28 LAB — OCCULT BLOOD X 1 CARD TO LAB, STOOL: Fecal Occult Bld: NEGATIVE

## 2017-06-28 LAB — TYPE AND SCREEN
ABO/RH(D): A POS
ANTIBODY SCREEN: NEGATIVE

## 2017-06-28 LAB — TROPONIN I
TROPONIN I: 1.8 ng/mL — AB (ref <0.03)
Troponin I: 1.52 ng/mL (ref <0.03)

## 2017-06-28 NOTE — Progress Notes (Signed)
Progress Note  Patient Name: Heather SilenceLinda Esparza Date of Encounter: 06/28/2017  Primary Cardiologist: Dr. Violeta GelinasYandle - [facility]  Subjective   Patient states she had one more episode of right sided chest pain last night that was similar to yesterday's witnessed episode. It went away on its own. She denies shortness of breath, palpitations, dizziness.  Inpatient Medications    Scheduled Meds: Ciprofloxacin 500mg  BID - end date 11/26 Amiodarone 200mg  MWF Anucort 25mg  BID suppository ASA 81mg  daily Atorvastatin 40mg  daily Carvedilol 3.125mg  BID Eliquis 5mg  BID Ferrous sulfate 325mg  daily Fluticasone-salmetarol daily Lasix 20mg  PO daily Lantus 7 units qhs Keppra 750mg  BID Imdur 30mg  daily Protonix 40mg  daily Prednisone 5mg  daily Zoloft 50mg  daily Trazodone 50mg  daily  Continuous Infusions:  PRN Meds:    Vital Signs    122/78, HR 60  Telemetry    SR, HR more consistently in mid-upper 50s - Personally Reviewed  ECG     n/a today  Physical Exam   GEN: No acute distress.   Neck: No JVD Cardiac: RRR, no murmurs, rubs, or gallops.  Respiratory: Clear to auscultation bilaterally. GI: Soft, nontender, non-distended  MS: No edema; No deformity. Neuro:  Nonfocal  Psych: Normal affect   Labs    Chemistry Recent Labs  Lab 06/25/17 0555 06/27/17 0625 06/28/17 0613  NA 142 143 143  K 3.7 4.7 4.6  CL 114* 110 110  CO2 22 26 28   GLUCOSE 96 107* 103*  BUN 33* 25* 25*  CREATININE 1.29* 1.26* 1.08*  CALCIUM 7.6* 8.7* 8.7*  GFRNONAA 42* 43* 52*  GFRAA 49* 50* >60  ANIONGAP 6 7 5      Hematology Recent Labs  Lab 06/23/17 1044 06/27/17 0625 06/28/17 0613  WBC 8.0 6.3 5.6  RBC 2.68* 2.72* 2.62*  HGB 8.0* 7.8* 7.5*  HCT 25.7* 26.2* 25.4*  MCV 95.9 96.3 96.9  MCH 29.9 28.7 28.6  MCHC 31.1 29.8* 29.5*  RDW 15.7* 15.6* 15.6*  PLT 137* 150 169    Cardiac Enzymes Recent Labs  Lab 06/27/17 0931 06/27/17 1250 06/27/17 2347 06/28/17 0613  TROPONINI 2.75*  2.36* 1.80* 1.52*   No results for input(s): TROPIPOC in the last 168 hours.   BNPNo results for input(s): BNP, PROBNP in the last 168 hours.   DDimer No results for input(s): DDIMER in the last 168 hours.   Radiology    No results found.  Cardiac Studies   Echo 04/10/2017: The left ventricle is normal in size. Ejection Fraction = >55%. There is severe concentric left ventricular hypertrophy. The right ventricle is normal size. The right ventricle is normal in structure and function. Systolic anterior motion of mitral valve leaflet noted with chordal, septal contact. Associated turbulent flow noted. There is mild mitral regurgitation (1+). There is a trivial pericardial effusion of no hemodynamic significance.  [facility]: [facility]:  1. Diffuse, calcified, moderate severity narrowings, with angiography being essentially stable from previous from May 2016 -May 2016: Chest pain admission --> false positive stress test --> LHC shows stable, moderate severity LAD disease; widely patient RCA stents; will treat medically. -June 2014: s/p rotablation guided PCI with DES x 2 (distal RCA, mid RCA)  Patient Profile     Heather Esparza is a 66 y.o. female who is being seen today for the evaluation of elevated troponin at the request of Dr. Manson PasseyBrown. Patient with significant CAD history s/p 3 DES stents in RCA and NSTEMIs in the past along with chronic respiratory failure from COPD  on chronic 2L oxygen, OSA, HTN, dCHF, HLD, morbid obesity, paroxysmal atrial fibrillation, and PAD s/p fem-pop bypass; she is [facility] in [facility] as bridge after recent hospitalization back to her SNF where she resides chronically. She started developing chest pain and was found to have elevated troponins with no acute EKG changes.  Assessment & Plan    NSTEMI: Troponin peaked at 3.23 and trended down. Patient with continued episodes of intermittent chest pain at rest.  --f/u  echo --imdur 30mg  daily --coreg 3.125mg  BID --will hold off on cath at this time  CAD: S/p 4 stents (most recent to RCA in 2014).  --atorvastatin 40mg  daily --coreg 3.125mg  BID - no room to titrate up at this time due to HR.  Anemia: Hgb down to 7.8>7.5 this morning.   Paroxysmal A fib: On amiodarone and Eliquis. EKG and tele with SR throughout stay.  CKD stage 3: Stable; Cr 1.08 - b/l today  For questions or updates, please contact [facility] Please consult [email or web address] for contact info under Cardiology/STEMI.      Signed, Nyra MarketGorica Svalina, MD  06/28/2017, 11:35 AM    I have examined the patient and reviewed assessment and plan and discussed with patient.  Agree with above as stated.  Troponin trending down.  Currently symptom free.  Hbg dropping.  SHe has a h/o anemia without known GI bleed.  SHe has required transfusion in the past.  THis could be contributing to her chest pain.  Will hold Eliquis for now in the event there is some occult bleeding. Would be interesting to see if Hbg increases.  No plan for cath.  She is hesitant to have anything done.  Echo pending,.  If echo is ok, could have [place]. Unclear etiology for NSTEMI, but could have been demand ischemia from anemia and COPD.  Lance MussJayadeep Chidinma Clites

## 2017-06-29 ENCOUNTER — Other Ambulatory Visit (HOSPITAL_BASED_OUTPATIENT_CLINIC_OR_DEPARTMENT_OTHER): Payer: Medicare Other

## 2017-06-29 ENCOUNTER — Other Ambulatory Visit (HOSPITAL_COMMUNITY): Payer: Self-pay

## 2017-06-29 DIAGNOSIS — R079 Chest pain, unspecified: Secondary | ICD-10-CM | POA: Diagnosis not present

## 2017-06-29 DIAGNOSIS — D649 Anemia, unspecified: Secondary | ICD-10-CM

## 2017-06-29 LAB — BASIC METABOLIC PANEL
ANION GAP: 5 (ref 5–15)
BUN: 30 mg/dL — ABNORMAL HIGH (ref 6–20)
CALCIUM: 8.4 mg/dL — AB (ref 8.9–10.3)
CHLORIDE: 109 mmol/L (ref 101–111)
CO2: 29 mmol/L (ref 22–32)
Creatinine, Ser: 1.14 mg/dL — ABNORMAL HIGH (ref 0.44–1.00)
GFR calc non Af Amer: 49 mL/min — ABNORMAL LOW (ref >60)
GFR, EST AFRICAN AMERICAN: 56 mL/min — AB (ref >60)
Glucose, Bld: 131 mg/dL — ABNORMAL HIGH (ref 65–99)
Potassium: 4.5 mmol/L (ref 3.5–5.1)
Sodium: 143 mmol/L (ref 135–145)

## 2017-06-29 LAB — CK TOTAL AND CKMB (NOT AT ARMC)
CK TOTAL: 16 U/L — AB (ref 38–234)
CK, MB: 2.5 ng/mL (ref 0.5–5.0)
RELATIVE INDEX: INVALID (ref 0.0–2.5)

## 2017-06-29 LAB — CBC
HEMATOCRIT: 26.3 % — AB (ref 36.0–46.0)
HEMOGLOBIN: 7.9 g/dL — AB (ref 12.0–15.0)
MCH: 29.3 pg (ref 26.0–34.0)
MCHC: 30 g/dL (ref 30.0–36.0)
MCV: 97.4 fL (ref 78.0–100.0)
Platelets: 188 10*3/uL (ref 150–400)
RBC: 2.7 MIL/uL — ABNORMAL LOW (ref 3.87–5.11)
RDW: 15.6 % — ABNORMAL HIGH (ref 11.5–15.5)
WBC: 6 10*3/uL (ref 4.0–10.5)

## 2017-06-29 LAB — TROPONIN I: Troponin I: 0.89 ng/mL (ref <0.03)

## 2017-06-29 MED ORDER — GENERIC EXTERNAL MEDICATION
Status: DC
Start: ? — End: 2017-06-29

## 2017-06-29 NOTE — Progress Notes (Signed)
Progress Note  Patient Name: Heather Esparza Stewart Date of Encounter: 06/29/2017  Primary Cardiologist: Dr. Violeta GelinasYandle - [facility]  Subjective   Patient without further episodes of chest pain. She does endorse a little more shortness of breath this morning compared to her usual. She denies palpitations.  Inpatient Medications    Scheduled Meds: Ciprofloxacin 500mg  BID - end date 11/26 Amiodarone 200mg  MWF Anucort 25mg  BID suppository ASA 81mg  daily Atorvastatin 40mg  daily Carvedilol 3.125mg  BID Eliquis 5mg  BID - d/c 11/28 Ferrous sulfate 325mg  daily Fluticasone-salmetarol daily Lasix 20mg  PO daily Lantus 7 units qhs Keppra 750mg  BID Imdur 30mg  daily Protonix 40mg  daily Prednisone 5mg  daily Zoloft 50mg  daily Trazodone 50mg  daily  Continuous Infusions:  PRN Meds:    Vital Signs    BP 127/68, HR 57  Telemetry    SR, HR in 50s to low 60s mainly - Personally Reviewed  ECG     n/a today  Physical Exam   GEN: No acute distress.   Neck: No JVD Cardiac: RRR, no murmurs, rubs, or gallops.  Respiratory: mild bibasilar crackles, no increased work of breathing. GI: Soft, nontender, non-distended  MS: No edema; no deformity. Neuro:  Nonfocal  Psych: Normal affect   Labs    Chemistry Recent Labs  Lab 06/27/17 0625 06/28/17 0613 06/29/17 0526  NA 143 143 143  K 4.7 4.6 4.5  CL 110 110 109  CO2 26 28 29   GLUCOSE 107* 103* 131*  BUN 25* 25* 30*  CREATININE 1.26* 1.08* 1.14*  CALCIUM 8.7* 8.7* 8.4*  GFRNONAA 43* 52* 49*  GFRAA 50* >60 56*  ANIONGAP 7 5 5      Hematology Recent Labs  Lab 06/27/17 0625 06/28/17 0613 06/29/17 0526  WBC 6.3 5.6 6.0  RBC 2.72* 2.62* 2.70*  HGB 7.8* 7.5* 7.9*  HCT 26.2* 25.4* 26.3*  MCV 96.3 96.9 97.4  MCH 28.7 28.6 29.3  MCHC 29.8* 29.5* 30.0  RDW 15.6* 15.6* 15.6*  PLT 150 169 188    Cardiac Enzymes Recent Labs  Lab 06/27/17 1250 06/27/17 2347 06/28/17 0613 06/29/17 0526  TROPONINI 2.36* 1.80* 1.52* 0.89*   No  results for input(s): TROPIPOC in the last 168 hours.   BNPNo results for input(s): BNP, PROBNP in the last 168 hours.   DDimer No results for input(s): DDIMER in the last 168 hours.   Radiology    No results found.  Cardiac Studies   Echo 04/10/2017: The left ventricle is normal in size. Ejection Fraction = >55%. There is severe concentric left ventricular hypertrophy. The right ventricle is normal size. The right ventricle is normal in structure and function. Systolic anterior motion of mitral valve leaflet noted with chordal, septal contact. Associated turbulent flow noted. There is mild mitral regurgitation (1+). There is a trivial pericardial effusion of no hemodynamic significance.  LHC: May 2017 [facility] for NSTEM[facility]:  1. Diffuse, calcified, moderate severity narrowings, with angiography being essentially stable from previous from May 2016 -May 2016: Chest pain admission --> false positive stress test --> LHC shows stable, moderate severity LAD disease; widely patient RCA stents; will treat medically. -June 2014: s/p rotablation guided PCI with DES x 2 (distal RCA, mid RCA)  Patient Profile     Heather Esparza Thurlow is a 66 y.o. female who is being seen today for the evaluation of elevated troponin at the request of Dr. Manson PasseyBrown. Patient with significant CAD history s/p 3 DES stents in RCA and NSTEMIs in the past along with chronic respiratory failure from COPD  on chronic 2L oxygen, OSA, HTN, dCHF, HLD, morbid obesity, paroxysmal atrial fibrillation, and PAD s/p fem-pop bypass; [facility] after recent hospitalization back to her SNF where she resides chronically. She started developing chest pain and was found to have elevated troponins with no acute EKG changes.  Assessment & Plan    NSTEMI: Troponin peaked at 3.23 and trended down. Could be precipitated by her acute on chronic anemia. She is scheduled for her echo this morning, if unchanged  from prior then she could have outpt follow up with her cardiologist. --f/u echo --imdur 30mg  daily --coreg 3.125mg  BID, no room to titrate up at this time --asa --atorvastatin 40mg  daily  CAD: S/p 4 stents (most recent to RCA in 2014).  --atorvastatin 40mg  daily --coreg 3.125mg  BID - no room to titrate up at this time due to HR  Anemia: Hgb 7.8>7.5>7.9 this morning. FOBT has been negative. Eliquis held yesterday due to worsening anemia - would continue holding until Hgb stabilizes if no source of bleeding is found.  Paroxysmal A fib: On amiodarone. EKG and tele with SR throughout stay. Holding Eliquis due to worsening anemia yesterday.  CKD stage 3: Stable; Cr 1.14 - b/l today  For questions or updates, please contact [facility] Please consult [email or web address] for contact info under Cardiology/STEMI.   Signed, Nyra MarketGorica Svalina, MD  06/29/2017, 10:57 AM    I have examined the patient and reviewed assessment and plan and discussed with patient.  Agree with above as stated.  Spoke with [facility] MD.  Could increase amiodarone to daily as her HR is ok.  Stable from a cardiac standpoint  Awaiting echo.  No plans for cath.  COntinue medical therapy with IMdur and beta blocker.  Lance MussJayadeep Varanasi

## 2017-06-29 NOTE — Progress Notes (Signed)
  Echocardiogram 2D Echocardiogram has been performed.  Heather Esparza 06/29/2017, 3:47 PM

## 2017-06-30 LAB — CBC
HCT: 26.9 % — ABNORMAL LOW (ref 36.0–46.0)
HEMOGLOBIN: 8 g/dL — AB (ref 12.0–15.0)
MCH: 28.9 pg (ref 26.0–34.0)
MCHC: 29.7 g/dL — AB (ref 30.0–36.0)
MCV: 97.1 fL (ref 78.0–100.0)
PLATELETS: 198 10*3/uL (ref 150–400)
RBC: 2.77 MIL/uL — AB (ref 3.87–5.11)
RDW: 15.6 % — ABNORMAL HIGH (ref 11.5–15.5)
WBC: 5 10*3/uL (ref 4.0–10.5)

## 2017-06-30 LAB — CK TOTAL AND CKMB (NOT AT ARMC)
CK TOTAL: 18 U/L — AB (ref 38–234)
CK, MB: 2.4 ng/mL (ref 0.5–5.0)
Relative Index: INVALID (ref 0.0–2.5)

## 2017-06-30 LAB — BASIC METABOLIC PANEL
Anion gap: 10 (ref 5–15)
BUN: 33 mg/dL — ABNORMAL HIGH (ref 6–20)
CHLORIDE: 110 mmol/L (ref 101–111)
CO2: 24 mmol/L (ref 22–32)
CREATININE: 1.28 mg/dL — AB (ref 0.44–1.00)
Calcium: 8.4 mg/dL — ABNORMAL LOW (ref 8.9–10.3)
GFR, EST AFRICAN AMERICAN: 49 mL/min — AB (ref >60)
GFR, EST NON AFRICAN AMERICAN: 42 mL/min — AB (ref >60)
Glucose, Bld: 110 mg/dL — ABNORMAL HIGH (ref 65–99)
POTASSIUM: 5.2 mmol/L — AB (ref 3.5–5.1)
SODIUM: 144 mmol/L (ref 135–145)

## 2017-06-30 LAB — TROPONIN I: TROPONIN I: 0.76 ng/mL — AB (ref <0.03)

## 2017-06-30 NOTE — Progress Notes (Addendum)
   Progress Note  Patient Name: Heather Esparza Date of Encounter: 06/30/2017  Primary Cardiologist: [place] Dr. Violeta GelinasYandle   Subjective   No chest pain reported  Inpatient Medications    Scheduled Meds:  Continuous Infusions:  PRN Meds:    Vital Signs    There were no vitals filed for this visit. No intake or output data in the 24 hours ending 06/30/17 1127 There were no vitals filed for this visit.  Telemetry    NSR- Personally Reviewed  ECG      Physical Exam   GEN: No acute distress.   Neck: No JVD Cardiac: RRR, no murmurs, rubs, or gallops.  Respiratory: Clear to auscultation bilaterally. GI: Soft, nontender, non-distended  MS: No edema; No deformity. Neuro:  Nonfocal  Psych: Normal affect   Labs    Chemistry Recent Labs  Lab 06/28/17 0613 06/29/17 0526 06/30/17 0548  NA 143 143 144  K 4.6 4.5 5.2*  CL 110 109 110  CO2 28 29 24   GLUCOSE 103* 131* 110*  BUN 25* 30* 33*  CREATININE 1.08* 1.14* 1.28*  CALCIUM 8.7* 8.4* 8.4*  GFRNONAA 52* 49* 42*  GFRAA >60 56* 49*  ANIONGAP 5 5 10      Hematology Recent Labs  Lab 06/28/17 0613 06/29/17 0526 06/30/17 0548  WBC 5.6 6.0 5.0  RBC 2.62* 2.70* 2.77*  HGB 7.5* 7.9* 8.0*  HCT 25.4* 26.3* 26.9*  MCV 96.9 97.4 97.1  MCH 28.6 29.3 28.9  MCHC 29.5* 30.0 29.7*  RDW 15.6* 15.6* 15.6*  PLT 169 188 198    Cardiac Enzymes Recent Labs  Lab 06/27/17 1250 06/27/17 2347 06/28/17 0613 06/29/17 0526  TROPONINI 2.36* 1.80* 1.52* 0.89*   No results for input(s): TROPIPOC in the last 168 hours.   BNPNo results for input(s): BNP, PROBNP in the last 168 hours.   DDimer No results for input(s): DDIMER in the last 168 hours.   Radiology    No results found.  Cardiac Studies   Echo shows no RWMA  Patient Profile     66 y.o. female with NSTEMI  Assessment & Plan    Medical therapy for NSTEMI.  May have been demand ischemia in the setting of anemia and COPD.  No RWMA.  Continue medical  therapy.  Will sign off.   Maintaining NSR.  Continue Amio.  Off Eliquis for now due to low hemoglobin.  Hgb now increasing slightly.   For questions or updates, please contact [facility] Please consult [email or web address] for contact info under Cardiology/STEMI.      Signed, Lance MussJayadeep Varanasi, MD  06/30/2017, 11:27 AM

## 2017-07-01 LAB — BASIC METABOLIC PANEL
ANION GAP: 6 (ref 5–15)
BUN: 29 mg/dL — AB (ref 6–20)
CALCIUM: 8.3 mg/dL — AB (ref 8.9–10.3)
CO2: 31 mmol/L (ref 22–32)
Chloride: 106 mmol/L (ref 101–111)
Creatinine, Ser: 1.19 mg/dL — ABNORMAL HIGH (ref 0.44–1.00)
GFR calc Af Amer: 54 mL/min — ABNORMAL LOW (ref >60)
GFR calc non Af Amer: 46 mL/min — ABNORMAL LOW (ref >60)
GLUCOSE: 103 mg/dL — AB (ref 65–99)
Potassium: 4.3 mmol/L (ref 3.5–5.1)
Sodium: 143 mmol/L (ref 135–145)

## 2017-07-01 LAB — TROPONIN I: Troponin I: 0.75 ng/mL (ref <0.03)

## 2018-09-30 DEATH — deceased

## 2019-02-08 IMAGING — CT CT HEAD W/O CM
4 series · 16 of 47 positions shown, 18 images · non-contrast
Comparison: None.

CLINICAL DATA: Headache following recent trauma. Nausea and
vomiting

EXAM:
CT HEAD WITHOUT CONTRAST
TECHNIQUE: Contiguous axial images were obtained from the base of the skull
through the vertex without intravenous contrast.

[Series 3: head without · axial · non-contrast · 0.42mm/px · z∈[-214,-89]mm · 7 of 35 slices shown, 9 images]
[im 5/35  brain]
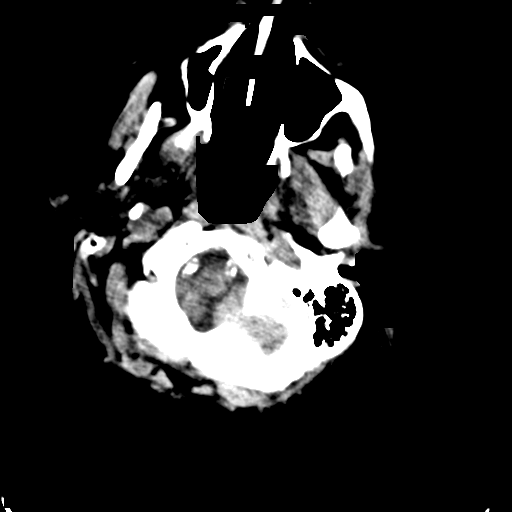
[im 5/35  bone]
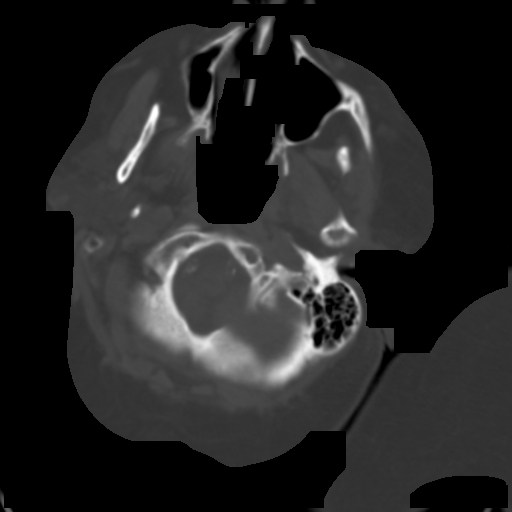
[im 9/35  brain]
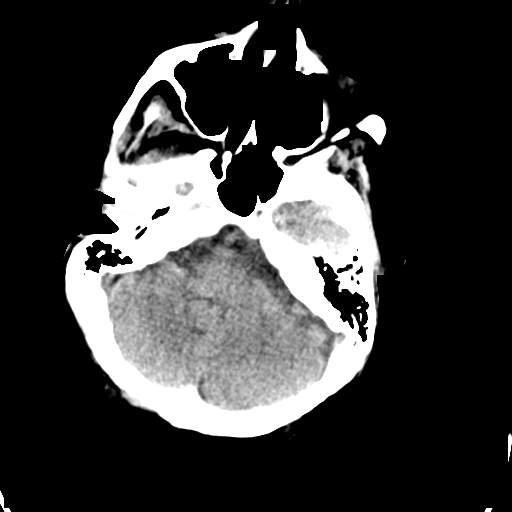
[im 13/35  brain]
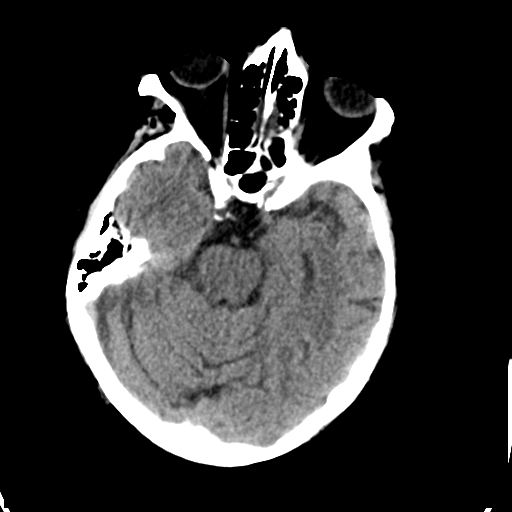
[im 18/35  brain]
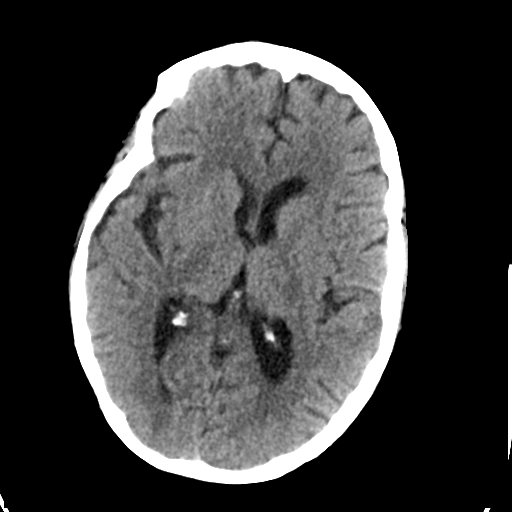
[im 22/35  brain]
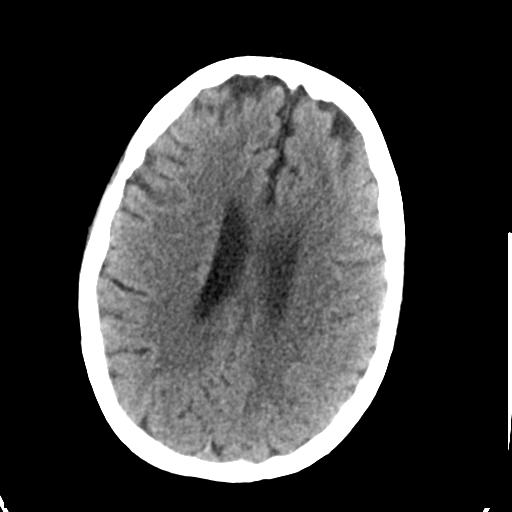
[im 22/35  bone]
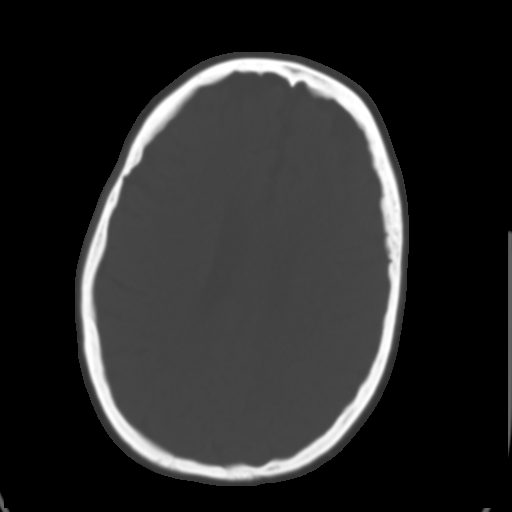
[im 26/35  brain]
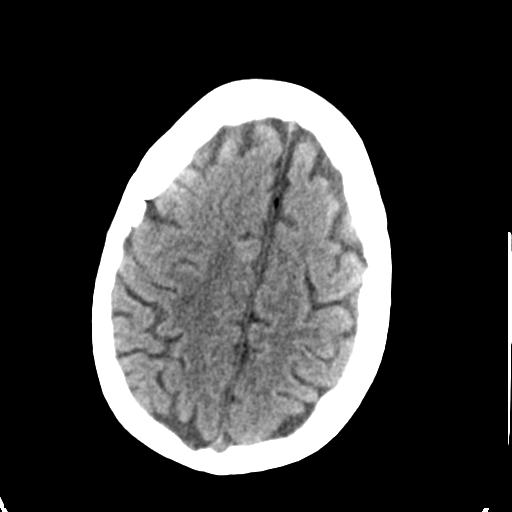
[im 30/35  brain]
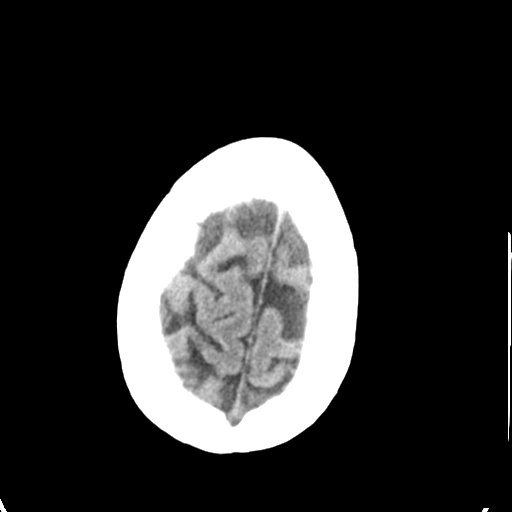

[Series 4: head bone · axial · 0.42mm/px · z∈[-218,-184]mm · 3 of 87 slices shown]
[im 9/87  bone]
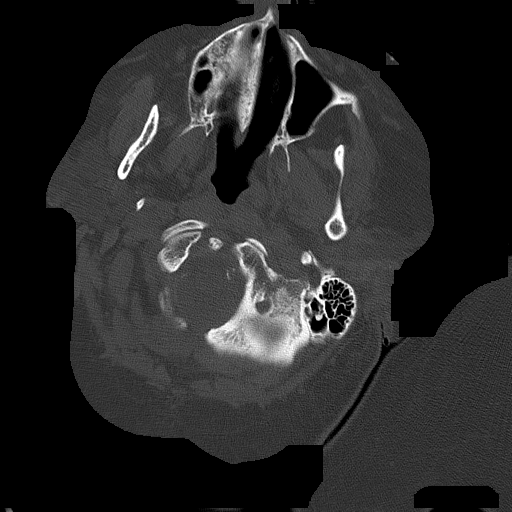
[im 18/87  bone]
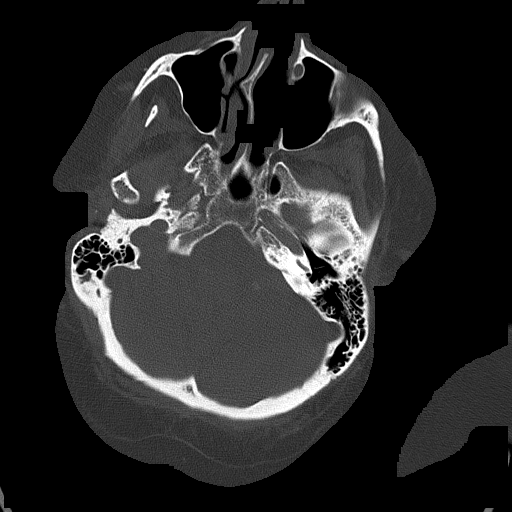
[im 26/87  bone]
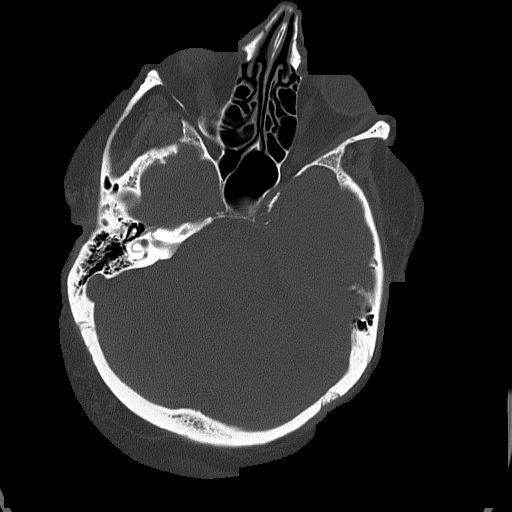

[Series 5: head without cor · coronal · non-contrast · 0.35mm/px · 3 of 70 slices shown]
[im 24/70  brain]
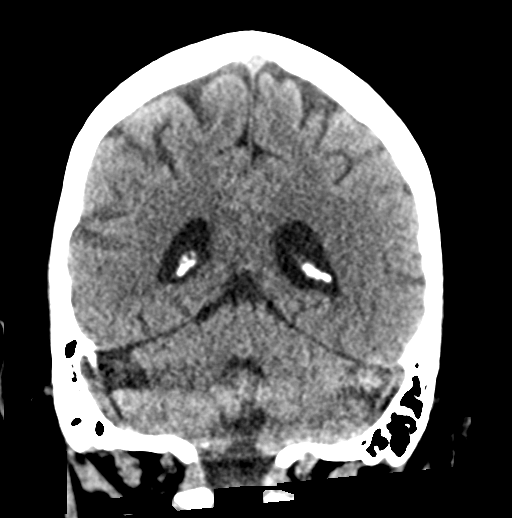
[im 31/70  brain]
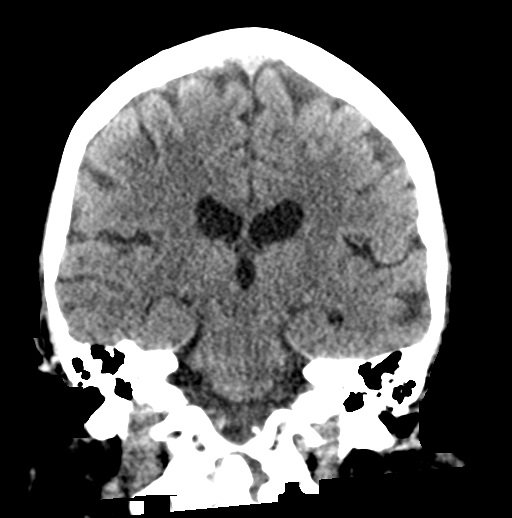
[im 39/70  brain]
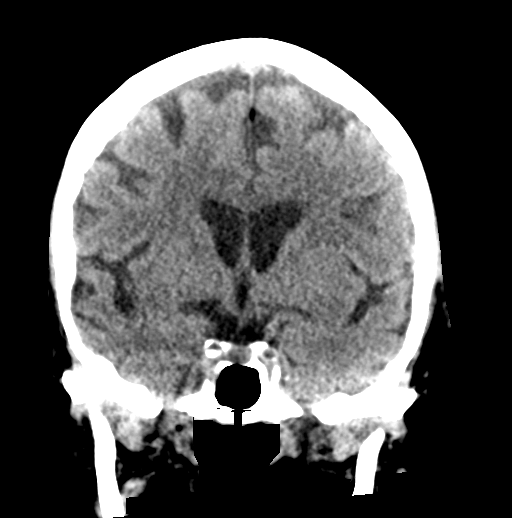

[Series 6: head without sag · sagittal · non-contrast · 0.34mm/px · 3 of 58 slices shown]
[im 22/58  brain]
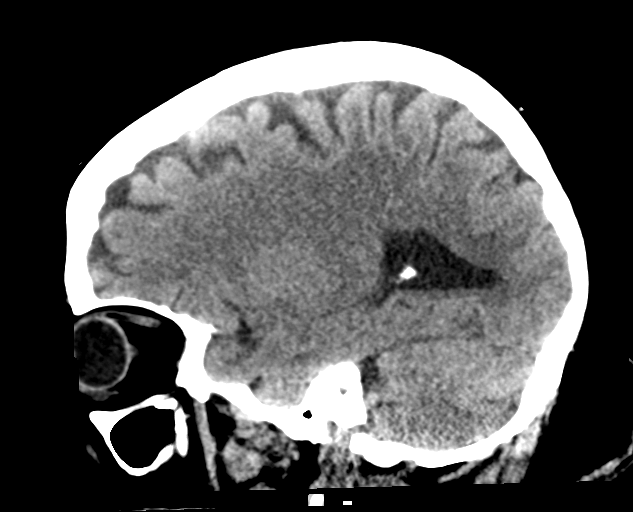
[im 29/58  brain]
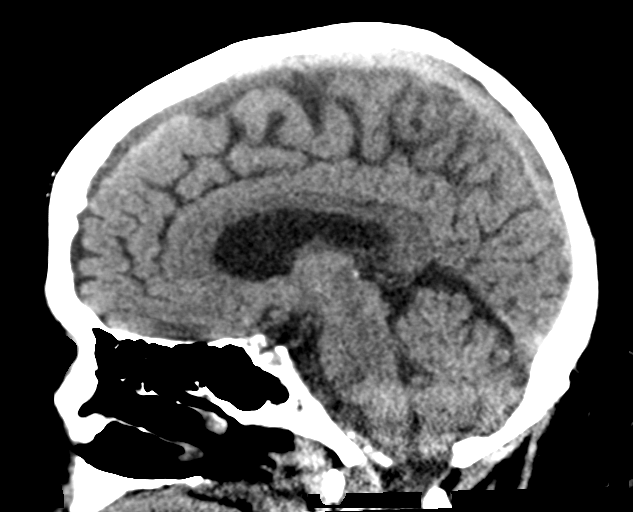
[im 37/58  brain]
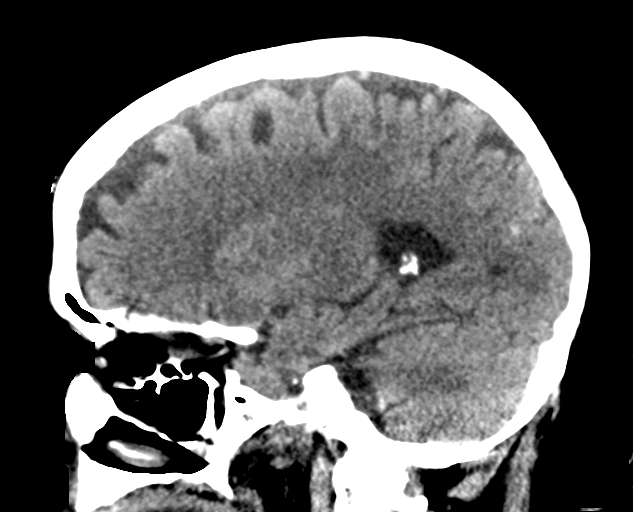

[16 of 47 positions shown; findings below may reference images not displayed]

FINDINGS: Brain: The ventricles and sulci are felt to be within normal limits
for age. There is no intracranial mass, hemorrhage, extra-axial
fluid collection, or midline shift. There is slight small vessel
disease in the centra semiovale bilaterally. Elsewhere, gray-white
compartments appear normal. No acute infarct evident.

Vascular: No hyperdense vessel. There is calcification in each
carotid siphon region as well as in each distal vertebral artery.

Skull: Bony calvarium appears intact.

Sinuses/Orbits: There is mucosal thickening in several ethmoid air
cells bilaterally. There is patchy opacity in the inferior right
frontal sinus. Other paranasal sinuses are clear. There is rightward
deviation of the nasal septum. Orbits appear symmetric bilaterally.

Other: Mastoid air cells are clear on the left. There is
opacification of several inferior mastoid air cells on the right.
IMPRESSION: Slight periventricular small vessel disease. No intracranial mass,
hemorrhage, or extra-axial fluid collection.

There are multiple foci of arterial vascular calcification. There is
mild paranasal sinus disease. There is opacification in several
inferior mastoid air cells on the right.

## 2019-02-11 IMAGING — DX DG ABD PORTABLE 1V
2 series · 2 of 2 positions shown · non-contrast
Comparison: None.

CLINICAL DATA: Initial evaluation for 1 week history of vomiting.

EXAM:
PORTABLE ABDOMEN - 1 VIEW

[abdomen kub (1 of 2)]
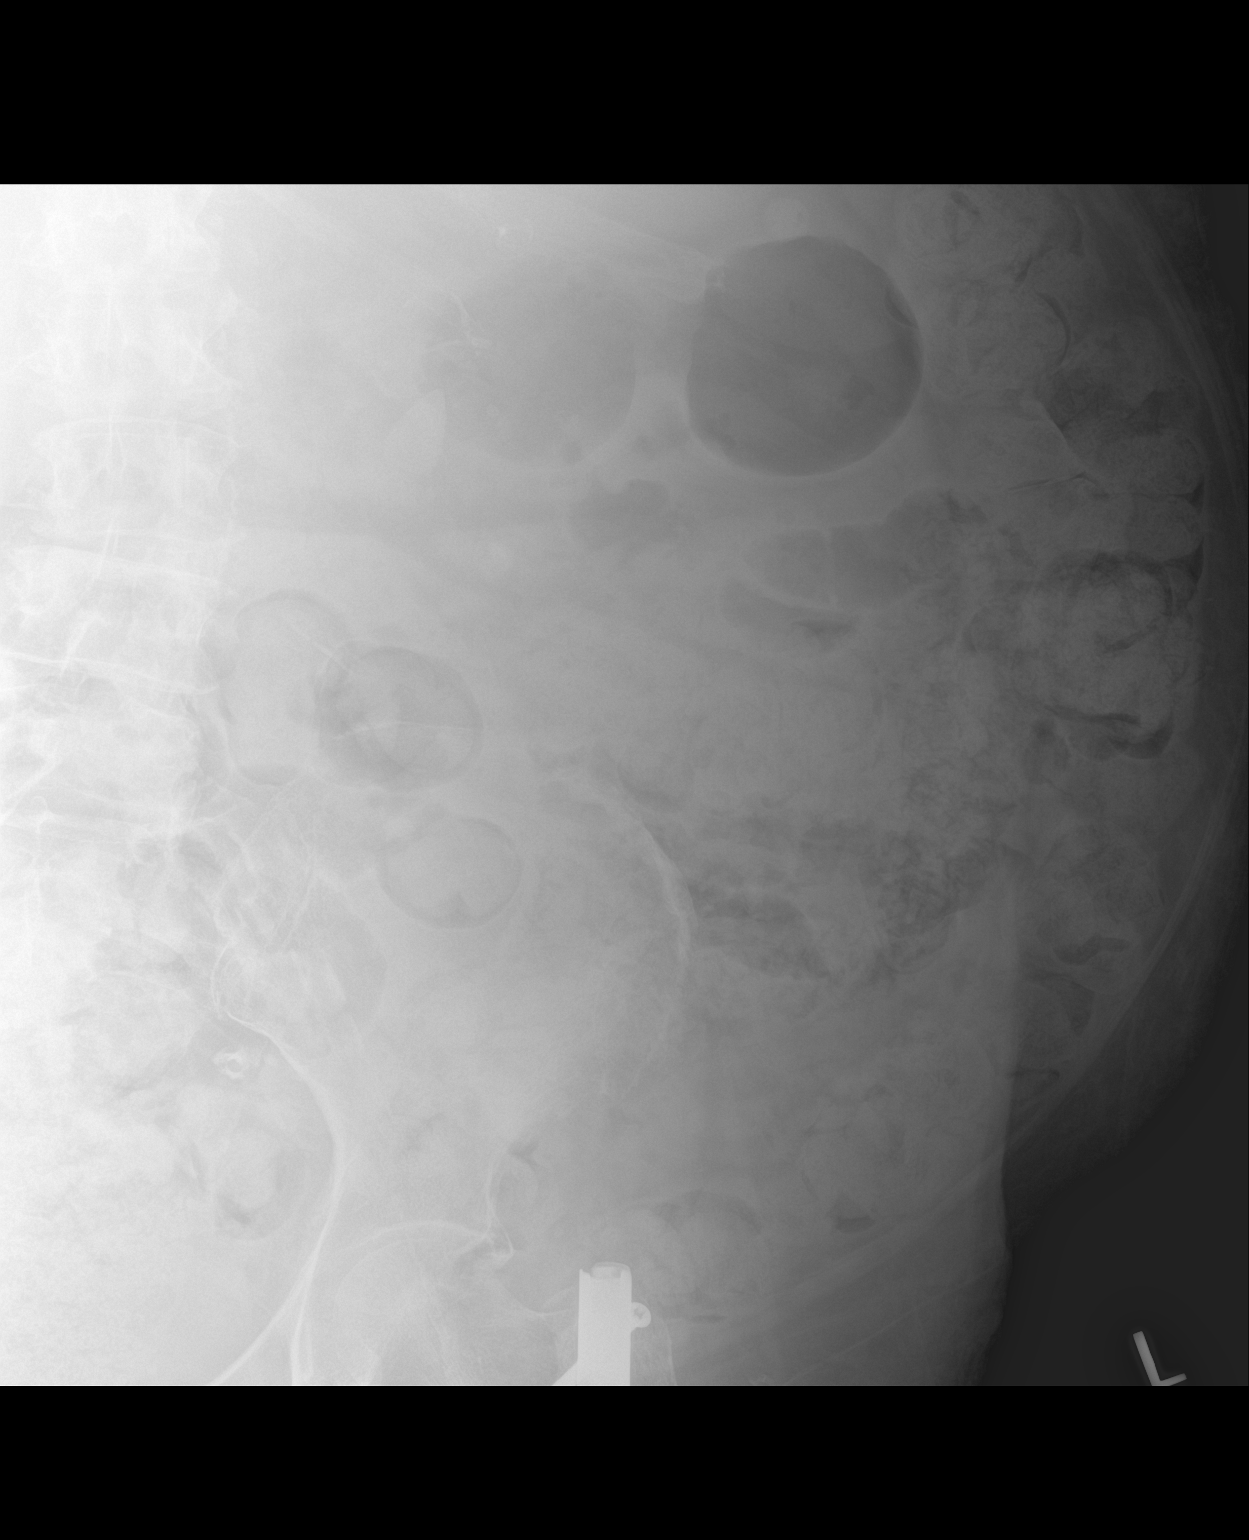

[abdomen kub (2 of 2)]
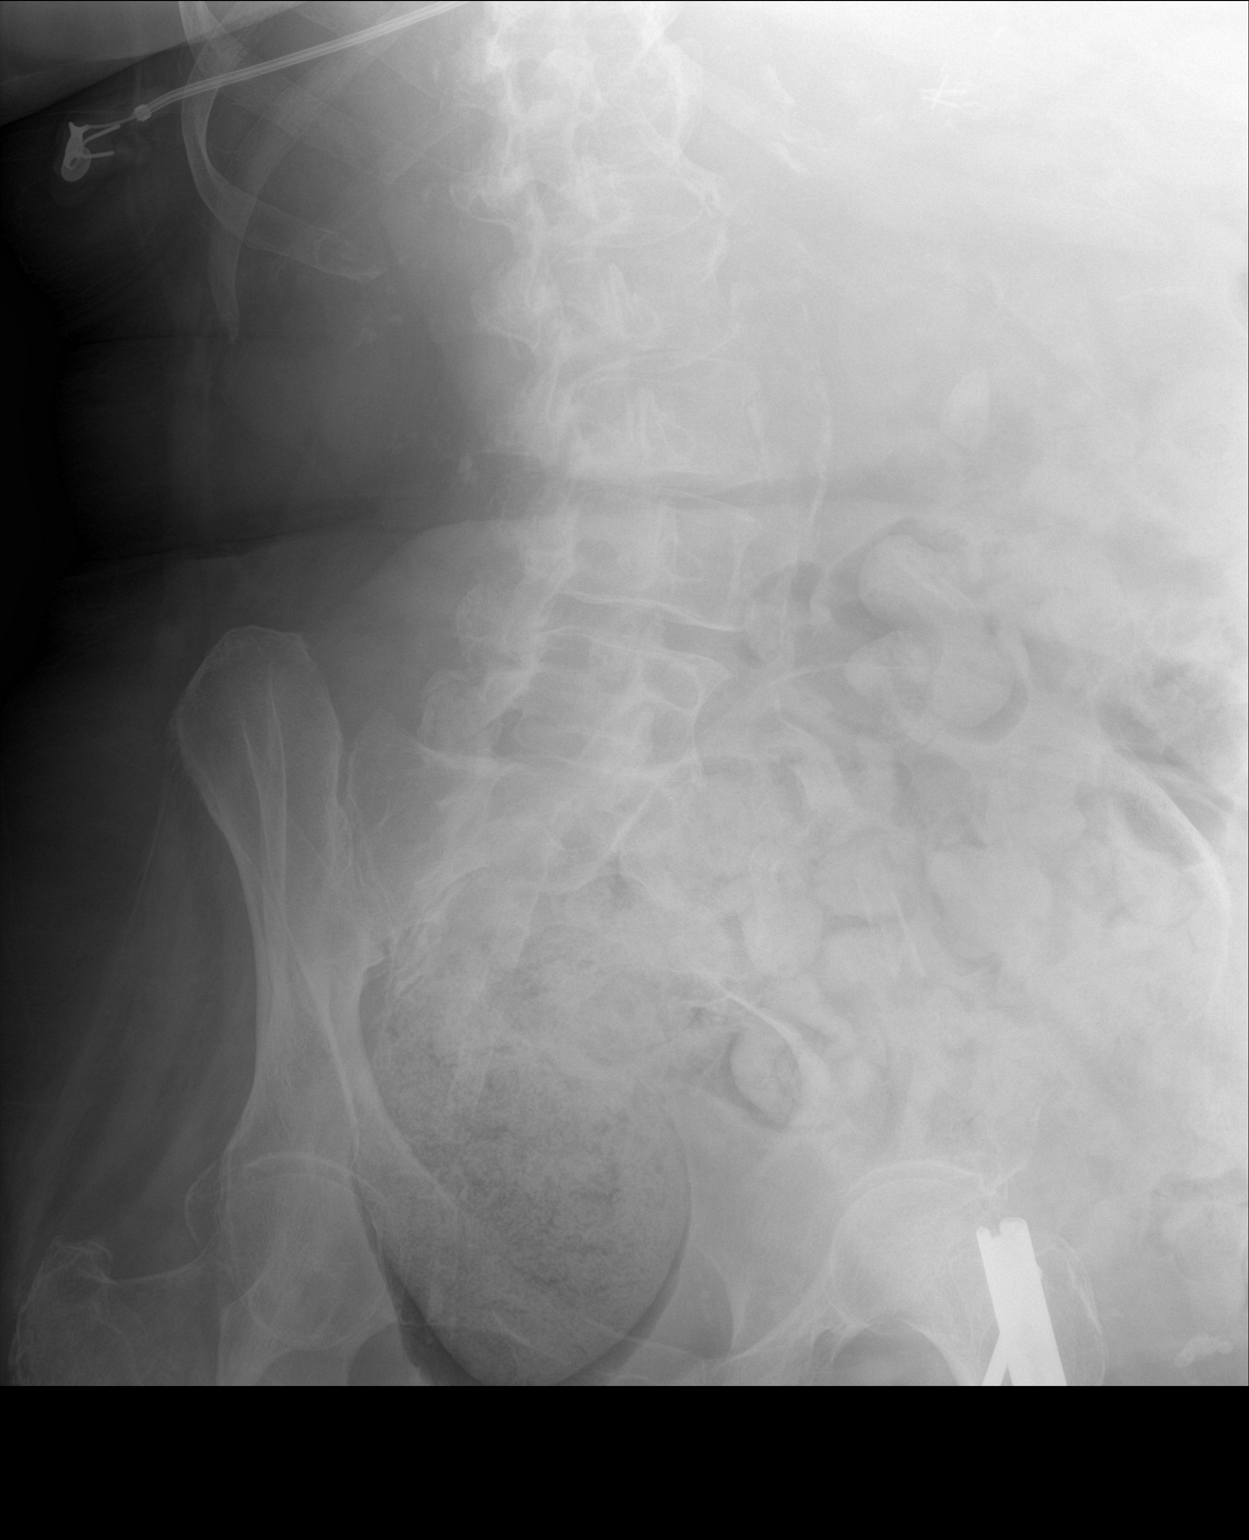

[2 of 2 positions shown; findings below may reference images not displayed]

FINDINGS: Examination somewhat limited by body habitus and patient
positioning. Visualized bowel gas pattern within normal limits
without obstruction or ileus. No appreciable abnormal bowel wall
thickening. Large volume stool throughout the colon and rectal
vault, suggesting constipation. 2.4 cm calcification overlying the
left abdomen suggestive of nephrolithiasis. Cholecystectomy clips
noted. Extensive aorto bi-iliac atherosclerotic disease.

I am male noted within the partially visualized left femur.
Scoliosis with multilevel degenerative spondylolysis.
IMPRESSION: 1. Nonobstructive bowel gas pattern. Large volume stool throughout
the colon suggestive of constipation.
2. 2.3 cm calcification overlying the left abdomen, likely a renal
calculus.
3. Prominent aorto bi-iliac atherosclerotic disease.

## 2019-02-13 IMAGING — DX DG ABD PORTABLE 1V
1 series · 1 of 1 positions shown · non-contrast
Comparison: Abdominal radiographs of May 10, 2017

CLINICAL DATA: No bowel movement for the past 8 days. The patient
ports abdominal pain and distension.

EXAM:
PORTABLE ABDOMEN - 1 VIEW

[abdomen kub]
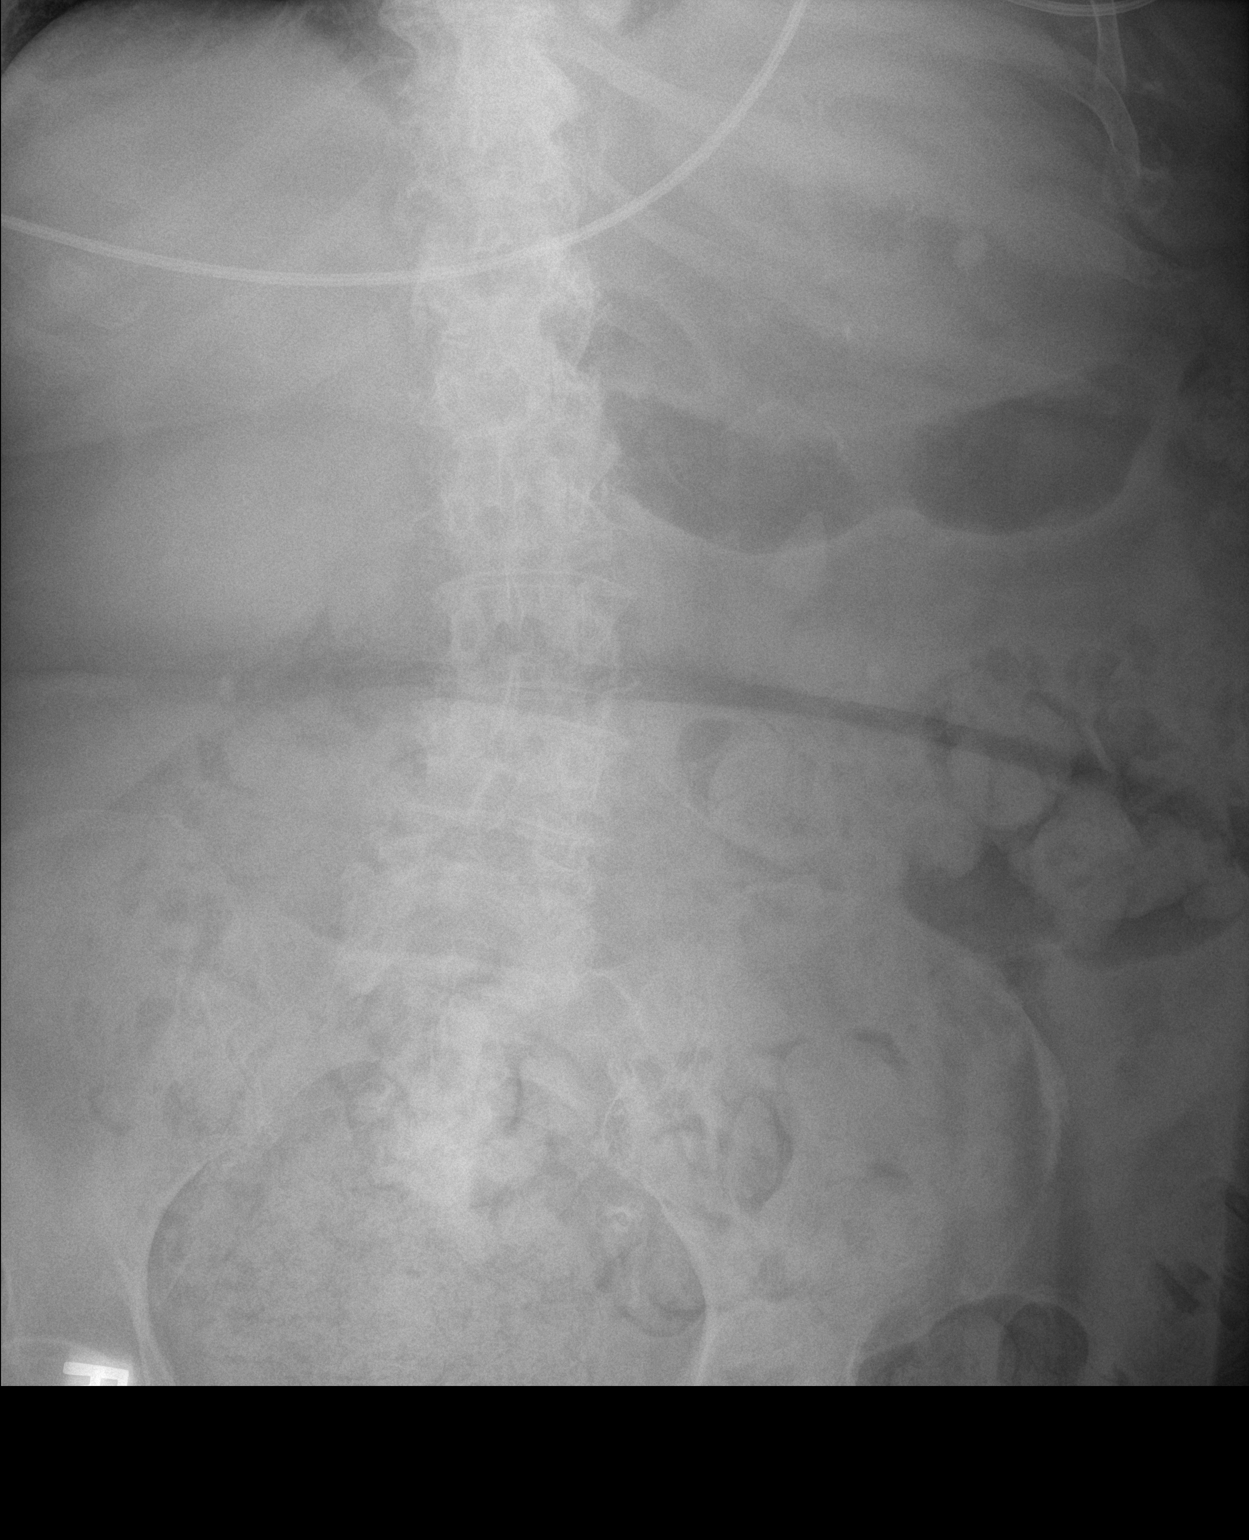

[1 of 1 positions shown; findings below may reference images not displayed]

FINDINGS: The colonic and rectal stool burden is increased. There is no small
bowel obstructive pattern. No free extraluminal gas is observed.
There is stable gentle levocurvature centered in the mid lumbar
spine.
IMPRESSION: Increase colonic and rectal stool burden. I cannot exclude a fecal
impaction.
